# Patient Record
Sex: Female | Born: 1995 | Race: White | Hispanic: No | Marital: Married | Smoking: Never smoker
Health system: Southern US, Community
[De-identification: ages and names within clinical notes are randomized; demographics above are authoritative.]

## PROBLEM LIST (undated history)

## (undated) DIAGNOSIS — J189 Pneumonia, unspecified organism: Secondary | ICD-10-CM

## (undated) DIAGNOSIS — R519 Headache, unspecified: Secondary | ICD-10-CM

## (undated) DIAGNOSIS — F419 Anxiety disorder, unspecified: Secondary | ICD-10-CM

## (undated) DIAGNOSIS — D649 Anemia, unspecified: Secondary | ICD-10-CM

## (undated) DIAGNOSIS — O139 Gestational [pregnancy-induced] hypertension without significant proteinuria, unspecified trimester: Secondary | ICD-10-CM

## (undated) DIAGNOSIS — Z91018 Allergy to other foods: Secondary | ICD-10-CM

## (undated) HISTORY — DX: Anxiety disorder, unspecified: F41.9

## (undated) HISTORY — PX: TONSILLECTOMY: SUR1361

---
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
## 2018-01-24 DIAGNOSIS — F419 Anxiety disorder, unspecified: Secondary | ICD-10-CM | POA: Insufficient documentation

## 2019-02-19 ENCOUNTER — Ambulatory Visit (INDEPENDENT_AMBULATORY_CARE_PROVIDER_SITE_OTHER): Payer: BC Managed Care – PPO | Admitting: Advanced Practice Midwife

## 2019-02-19 ENCOUNTER — Encounter: Payer: Self-pay | Admitting: Advanced Practice Midwife

## 2019-02-19 ENCOUNTER — Other Ambulatory Visit (HOSPITAL_COMMUNITY)
Admission: RE | Admit: 2019-02-19 | Discharge: 2019-02-19 | Disposition: A | Discharge status: Home / Self Care | Payer: BC Managed Care – PPO | Source: Ambulatory Visit | Attending: Advanced Practice Midwife | Admitting: Advanced Practice Midwife

## 2019-02-19 ENCOUNTER — Other Ambulatory Visit: Payer: Self-pay

## 2019-02-19 VITALS — BP 110/70 | Ht 59.0 in | Wt 127.0 lb

## 2019-02-19 DIAGNOSIS — Z3400 Encounter for supervision of normal first pregnancy, unspecified trimester: Secondary | ICD-10-CM | POA: Insufficient documentation

## 2019-02-19 DIAGNOSIS — Z3401 Encounter for supervision of normal first pregnancy, first trimester: Secondary | ICD-10-CM

## 2019-02-19 DIAGNOSIS — Z113 Encounter for screening for infections with a predominantly sexual mode of transmission: Secondary | ICD-10-CM | POA: Insufficient documentation

## 2019-02-19 DIAGNOSIS — Z124 Encounter for screening for malignant neoplasm of cervix: Secondary | ICD-10-CM | POA: Insufficient documentation

## 2019-02-19 DIAGNOSIS — Z3A01 Less than 8 weeks gestation of pregnancy: Secondary | ICD-10-CM

## 2019-02-19 NOTE — Progress Notes (Signed)
NOB today. LMP: 01/03/2019

## 2019-02-19 NOTE — Progress Notes (Signed)
New Obstetric Patient H&P    Chief Complaint: "Desires prenatal care"   History of Present Illness: Patient is a 23 y.o. G1P0 Not Hispanic or Latino female, presents with amenorrhea and positive home pregnancy test. Patient's last menstrual period was 01/03/2019 (exact date). and based on her  LMP, her EDD is Estimated Date of Delivery: 10/10/19 and her EGA is 5037w5d. Cycles are 4-5 days, regular, and occur approximately every : 28 days. Her first PAP smear is today.   She had a urine pregnancy test which was positive 3 week(s)  ago. Her last menstrual period was normal and lasted for  4 or 5 day(s). Since her LMP she claims she has experienced breast tenderness, fatigue, nausea, headaches. She denies vaginal bleeding. 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 This is her first pregnancy.   Since her LMP, she admits to the use of tobacco products  no She claims she has gained  2 pounds since the start of her pregnancy.  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 no  Genetic Screening/Teratology Counseling: (Includes patient, baby's father, or anyone in either family with:)   1. [facility]= [facility]  no 2. Thalassemia ([place], [place], Mediterranean, or Asian background): MCV<80  no 3. Neural tube defect (meningomyelocele, spina bifida, anencephaly)  no 4. Congenital heart defect  no  5. Down syndrome  no 6. Tay-Sachs (Jewish, [place])  no 7. Canavan's Disease  no 8. Sickle cell disease or trait (African)  no  9. Hemophilia or other blood disorders  no  10. Muscular dystrophy  no  11.  Cystic fibrosis  no  12. Huntington's Chorea  no  13. Mental retardation/autism  no 14. Other inherited genetic or chromosomal disorder  no 15. Maternal metabolic disorder (DM, PKU, etc)  no 16. Patient or FOB with a child with a birth defect not listed above no  16a. Patient or FOB with a birth defect themselves no 17. Recurrent pregnancy loss, or stillbirth  no  18. Any medications since LMP other than prenatal vitamins (include vitamins, supplements, OTC meds, drugs, alcohol)  Effexor 75 mg 19. Any other genetic/environmental exposure to discuss  no  Infection History:   1. Lives with someone with TB or TB exposed  no  2. Patient or partner has history of genital herpes  no 3. Rash or viral illness since LMP  no 4. History of STI (GC, CT, HPV, syphilis, HIV)  no 5. History of recent travel :  no  Other pertinent information:  Patient plans to breastfeed    Review of Systems:10 point review of systems negative unless otherwise noted in HPI  Past Medical History:  Past Medical History:  Diagnosis Date  . Anxiety     Past Surgical History:  Past Surgical History:  Procedure Laterality Date  . TONSILLECTOMY      Gynecologic History: Patient's last menstrual period was 01/03/2019 (exact date).  Obstetric History: G1P0  Family History:  History reviewed. No pertinent family history.  Social History:  Social History   Socioeconomic History  .  Marital status: Single    Spouse name: Not on file  . Number of children: Not on file  . Years of education: Not on file  . Highest education level: Not on file  Occupational History  . Not on file  Social Needs  . Financial resource strain: Not on file  . Food insecurity    Worry: Not on file    Inability: Not on file  . Transportation needs    Medical: Not on file    Non-medical: Not on file  Tobacco Use  . Smoking status: Never Smoker  . Smokeless tobacco: Never Used  Substance and Sexual Activity  . Alcohol use:  Not Currently  . Drug use: Never  . Sexual activity: Yes    Birth control/protection: None  Lifestyle  . Physical activity    Days per week: Not on file    Minutes per session: Not on file  . Stress: Not on file  Relationships  . Social Herbalist on phone: Not on file    Gets together: Not on file    Attends religious service: Not on file    Active member of club or organization: Not on file    Attends meetings of clubs or organizations: Not on file    Relationship status: Not on file  . Intimate partner violence    Fear of current or ex partner: Not on file    Emotionally abused: Not on file    Physically abused: Not on file    Forced sexual activity: Not on file  Other Topics Concern  . Not on file  Social History Narrative  . Not on file    Allergies:  Allergies  Allergen Reactions  . Pork-Derived Products Swelling    Face swelling and severe cramps  . Beef-Derived Products     Medications: Prior to Admission medications   Medication Sig Start Date End Date Taking? Authorizing Provider  venlafaxine XR (EFFEXOR-XR) 75 MG 24 hr capsule Take 75 mg by mouth daily. 02/12/19  Yes [provider]    Physical Exam Vitals: Blood pressure 110/70, height 4\' 11"  (1.499 m), weight 127 lb (57.6 kg), last menstrual period 01/03/2019.  General: NAD HEENT: normocephalic, anicteric Thyroid: no enlargement, no palpable nodules Pulmonary: No increased work of breathing, CTAB Cardiovascular: RRR, distal pulses 2+ Abdomen: NABS, soft, non-tender, non-distended.  Umbilicus without lesions.  No hepatomegaly, splenomegaly or masses palpable. No evidence of hernia  Genitourinary:  External: Normal external female genitalia.  Normal urethral meatus, normal  Bartholin's and Skene's glands.    Vagina: Normal vaginal mucosa, no evidence of prolapse.    Cervix: Grossly normal in appearance, no bleeding, no CMT  Uterus:  Non-enlarged, mobile, normal contour.    Adnexa:  ovaries non-enlarged, no adnexal masses  Rectal: deferred Extremities: no edema, erythema, or tenderness Neurologic: Grossly intact Psychiatric: mood appropriate, affect full  The following were addressed during this visit:  Breastfeeding Education - Early initiation of breastfeeding    Comments: Keeps milk supply adequate, helps contract uterus and slow bleeding, and early milk is the perfect first food and is easy to digest.   - The importance of exclusive breastfeeding    Comments: Provides antibodies, Lower risk of breast and ovarian cancers, and type-2 diabetes,Helps your body recover, Reduced chance of SIDS.   - The importance of early skin-to-skin contact    Comments: Keeps baby warm and secure, helps keep baby's blood sugar up and breathing steady, easier to bond  and breastfeed, and helps calm baby.  - Effective positioning and attachment    Comments: Helps my baby to get enough breast milk, helps to produce an adequate milk supply, and helps prevent nipple pain and damage   - Exclusive breastfeeding for the first 6 months    Comments: Builds a healthy milk supply and keeps it up, protects baby from sickness and disease, and breastmilk has everything your baby needs for the first 6 months.   Assessment: 23 y.o. G1P0 at [redacted]w[redacted]d by exact LMP presenting to initiate prenatal care  Plan: 1) Avoid alcoholic beverages. 2) Patient encouraged not to smoke.  3) Discontinue the use of all non-medicinal drugs and chemicals.  4) Take prenatal vitamins daily.  5) Nutrition, food safety (fish, cheese advisories, and high nitrite foods) and exercise discussed. 6) Hospital and practice style discussed with cross coverage system.  7) Genetic Screening, such as with 1st Trimester Screening, cell free fetal DNA, AFP testing, and Ultrasound, as well as with amniocentesis and CVS as appropriate, is discussed with patient. At the conclusion of today's visit patient requested 1st trimester/NT  screen genetic testing at 12 weeks 8) Patient is asked about travel to areas at risk for the Zika virus, and counseled to avoid travel and exposure to mosquitoes or sexual partners who may have themselves been exposed to the virus. Testing is discussed, and will be ordered as appropriate.  9) PAPtima, urine culture today 10) return in 1 week for dating and rob 11) NOB panel (future ordered) and 1st trimester screen/NT scan at 12 weeks   Tresea Mall, CNM [facility] 02/19/2019, 3:22 PM

## 2019-02-19 NOTE — Patient Instructions (Signed)
Exercise During Pregnancy Exercise is an important part of being healthy for people of all ages. Exercise improves the function of your heart and lungs and helps you maintain strength, flexibility, and a healthy body weight. Exercise also boosts energy levels and elevates mood. Most women should exercise regularly during pregnancy. In rare cases, women with certain medical conditions or complications may be asked to limit or avoid exercise during pregnancy. How does this affect me? Along with maintaining general strength and flexibility, exercising during pregnancy can help:  Keep strength in muscles that are used during labor and childbirth.  Decrease low back pain.  Reduce symptoms of depression.  Control weight gain during pregnancy.  Reduce the risk of needing insulin if you develop diabetes during pregnancy.  Decrease the risk of cesarean delivery.  Speed up your recovery after giving birth. How does this affect my baby? Exercise can help you have a healthy pregnancy. Exercise does not cause premature birth. It will not cause your baby to weigh less at birth. What exercises can I do? Many exercises are safe for you to do during pregnancy. Do a variety of exercises that safely increase your heart and breathing rates and help you build and maintain muscle strength. Do exercises exactly as told by your health care provider. You may do these exercises:  Walking or hiking.  Swimming.  Water aerobics.  Riding a stationary bike.  Strength training.  Modified yoga or Pilates. Tell your instructor that you are pregnant. Avoid overstretching, and avoid lying on your back for long periods of time.  Running or jogging. Only choose this type of exercise if you: ? Ran or jogged regularly before your pregnancy. ? Can run or jog and still talk in complete sentences. What exercises should I avoid? Depending on your level of fitness and whether you exercised regularly before your  pregnancy, you may be told to limit high-intensity exercise. You can tell that you are exercising at a high intensity if you are breathing much harder and faster and cannot hold a conversation while exercising. You must avoid:  Contact sports.  Activities that put you at risk for falling on or being hit in the belly, such as downhill skiing, water skiing, surfing, rock climbing, cycling, gymnastics, and horseback riding.  Scuba diving.  Skydiving.  Yoga or Pilates in a room that is heated to high temperatures.  Jogging or running, unless you ran or jogged regularly before your pregnancy. While jogging or running, you should always be able to talk in full sentences. Do not run or jog so fast that you are unable to have a conversation.  Do not exercise at more than 6,000 feet above sea level (high elevation) if you are not used to exercising at high elevation. How do I exercise in a safe way?   Avoid overheating. Do not exercise in very high temperatures.  Wear loose-fitting, breathable clothes.  Avoid dehydration. Drink enough water before, during, and after exercise to keep your urine pale yellow.  Avoid overstretching. Because of hormone changes during pregnancy, it is easy to overstretch muscles, tendons, and ligaments during pregnancy.  Start slowly and ask your health care provider to recommend the types of exercise that are safe for you.  Do not exercise to lose weight. Follow these instructions at home:  Exercise on most days or all days of the week. Try to exercise for 30 minutes a day, 5 days a week, unless your health care provider tells you not to.  If  you actively exercised before your pregnancy and you are healthy, your health care provider may tell you to continue to do moderate to high-intensity exercise.  If you are just starting to exercise or did not exercise much before your pregnancy, your health care provider may tell you to do low to moderate-intensity  exercise. Questions to ask your health care provider  Is exercise safe for me?  What are signs that I should stop exercising?  Does my health condition mean that I should not exercise during pregnancy?  When should I avoid exercising during pregnancy? Stop exercising and contact a health care provider if: You have any unusual symptoms, such as:  Mild contractions of the uterus or cramps in the abdomen.  Dizziness that does not go away when you rest. Stop exercising and get help right away if: You have any unusual symptoms, such as:  Sudden, severe pain in your low back or your belly.  Mild contractions of the uterus or cramps in the abdomen that do not improve with rest and drinking fluids.  Chest pain.  Bleeding or fluid leaking from your vagina.  Shortness of breath. These symptoms may represent a serious problem that is an emergency. Do not wait to see if the symptoms will go away. Get medical help right away. Call your local emergency services (911 in the [place] Do not drive yourself to the hospital. Summary  Most women should exercise regularly throughout pregnancy. In rare cases, women with certain medical conditions or complications may be asked to limit or avoid exercise during pregnancy.  Do not exercise to lose weight during pregnancy.  Your health care provider will tell you what level of physical activity is right for you.  Stop exercising and contact a health care provider if you have mild contractions of the uterus or cramps in the abdomen. Get help right away if these contractions or cramps do not improve with rest and drinking fluids.  Stop exercising and get help right away if you have sudden, severe pain in your low back or belly, chest pain, shortness of breath, or bleeding or leaking of fluid from your vagina. This information is not intended to replace advice given to you by your health care provider. Make sure you discuss any questions you have with your  health care provider. Document Released: 03/06/2005 Document Revised: 06/27/2018 Document Reviewed: 04/10/2018 Elsevier Patient Education  2020 [place] for Pregnant Women While you are pregnant, your body requires additional nutrition to help support your growing baby. You also have a higher need for some vitamins and minerals, such as folic acid, calcium, iron, and vitamin D. Eating a healthy, well-balanced diet is very important for your health and your baby's health. Your need for extra calories varies for the three 53-monthsegments of your pregnancy (trimesters). For most women, it is recommended to consume:  150 extra calories a day during the first trimester.  300 extra calories a day during the second trimester.  300 extra calories a day during the third trimester. What are tips for following this plan?   Do not try to lose weight or go on a diet during pregnancy.  Limit your overall intake of foods that have "empty calories." These are foods that have little nutritional value, such as sweets, desserts, candies, and sugar-sweetened beverages.  Eat a variety of foods (especially fruits and vegetables) to get a full range of vitamins and minerals.  Take a prenatal vitamin to help meet your additional vitamin  and mineral needs during pregnancy, specifically for folic acid, iron, calcium, and vitamin D.  Remember to stay active. Ask your health care provider what types of exercise and activities are safe for you.  Practice good food safety and cleanliness. Wash your hands before you eat and after you prepare raw meat. Wash all fruits and vegetables well before peeling or eating. Taking these actions can help to prevent food-borne illnesses that can be very dangerous to your baby, such as listeriosis. Ask your health care provider for more information about listeriosis. What does 150 extra calories look like? Healthy options that provide 150 extra calories each day  could be any of the following:  6-8 oz (170-230 g) of plain low-fat yogurt with  cup of berries.  1 apple with 2 teaspoons (11 g) of peanut butter.  Cut-up vegetables with  cup (60 g) of hummus.  8 oz (230 mL) or 1 cup of low-fat chocolate milk.  1 stick of string cheese with 1 medium orange.  1 peanut butter and jelly sandwich that is made with one slice of whole-wheat bread and 1 tsp (5 g) of peanut butter. For 300 extra calories, you could eat two of those healthy options each day. What is a healthy amount of weight to gain? The right amount of weight gain for you is based on your BMI before you became pregnant. If your BMI:  Was less than 18 (underweight), you should gain 28-40 lb (13-18 kg).  Was 18-24.9 (normal), you should gain 25-35 lb (11-16 kg).  Was 25-29.9 (overweight), you should gain 15-25 lb (7-11 kg).  Was 30 or greater (obese), you should gain 11-20 lb (5-9 kg). What if I am having twins or multiples? Generally, if you are carrying twins or multiples:  You may need to eat 300-600 extra calories a day.  The recommended range for total weight gain is 25-54 lb (11-25 kg), depending on your BMI before pregnancy.  Talk with your health care provider to find out about nutritional needs, weight gain, and exercise that is right for you. What foods can I eat?  Grains All grains. Choose whole grains, such as whole-wheat bread, oatmeal, or [place] rice. Vegetables All vegetables. Eat a variety of colors and types of vegetables. Remember to wash your vegetables well before peeling or eating. Fruits All fruits. Eat a variety of colors and types of fruit. Remember to wash your fruits well before peeling or eating. Meats and other protein foods Lean meats, including chicken, [place], fish, and lean cuts of beef, veal, or pork. If you eat fish or seafood, choose options that are higher in omega-3 fatty acids and lower in mercury, such as salmon, herring, mussels, trout,  sardines, pollock, shrimp, crab, and lobster. Tofu. Tempeh. Beans. Eggs. Peanut butter and other nut butters. Make sure that all meats, poultry, and eggs are cooked to food-safe temperatures or "well-done." Two or more servings of fish are recommended each week in order to get the most benefits from omega-3 fatty acids that are found in seafood. Choose fish that are lower in mercury. You can find more information online:  [place]Gaming.ch Dairy Pasteurized milk and milk alternatives (such as almond milk). Pasteurized yogurt and pasteurized cheese. Cottage cheese. Sour cream. Beverages Water. Juices that contain 100% fruit juice or vegetable juice. Caffeine-free teas and decaffeinated coffee. Drinks that contain caffeine are okay to drink, but it is better to avoid caffeine. Keep your total caffeine intake to less than 200 mg each day (which  is 12 oz or 355 mL of coffee, tea, or soda) or the limit as told by your health care provider. Fats and oils Fats and oils are okay to include in moderation. Sweets and desserts Sweets and desserts are okay to include in moderation. Seasoning and other foods All pasteurized condiments. The items listed above may not be a complete list of recommended foods and beverages. Contact your dietitian for more options. The items listed above may not be a complete list of foods and beverages [you/your child] can eat. Contact a dietitian for more information. What foods are not recommended? Vegetables Raw (unpasteurized) vegetable juices. Fruits Unpasteurized fruit juices. Meats and other protein foods Lunch meats, bologna, hot dogs, or other deli meats. (If you must eat those meats, reheat them until they are steaming hot.) Refrigerated pat, meat spreads from a meat counter, smoked seafood that is found in the refrigerated section of a store. Raw or undercooked meats, poultry, and eggs. Raw fish, such as sushi or sashimi. Fish that have high mercury content, such as  tilefish, shark, swordfish, and king mackerel. To learn more about mercury in fish, talk with your health care provider or look for online resources, such as:  [place]ch Dairy Raw (unpasteurized) milk and any foods that have raw milk in them. Soft cheeses, such as feta, queso blanco, queso fresco, Brie, Camembert cheeses, blue-veined cheeses, and Panela cheese (unless it is made with pasteurized milk, which must be stated on the label). Beverages Alcohol. Sugar-sweetened beverages, such as sodas, teas, or energy drinks. Seasoning and other foods Homemade fermented foods and drinks, such as pickles, sauerkraut, or kombucha drinks. (Store-bought pasteurized versions of these are okay.) Salads that are made in a store or deli, such as ham salad, chicken salad, egg salad, tuna salad, and seafood salad. The items listed above may not be a complete list of foods and beverages to avoid. Contact your dietitian for more information. The items listed above may not be a complete list of foods and beverages [you/your child] should avoid. Contact a dietitian for more information. Where to find more information To calculate the number of calories you need based on your height, weight, and activity level, you can use an online calculator such as:  MobileTransition.ch To calculate how much weight you should gain during pregnancy, you can use an online pregnancy weight gain calculator such as:  StreamingFood.com.cy Summary  While you are pregnant, your body requires additional nutrition to help support your growing baby.  Eat a variety of foods, especially fruits and vegetables to get a full range of vitamins and minerals.  Practice good food safety and cleanliness. Wash your hands before you eat and after you prepare raw meat. Wash all fruits and vegetables well before peeling or eating. Taking these actions can help to prevent food-borne illnesses, such as  listeriosis, that can be very dangerous to your baby.  Do not eat raw meat or fish. Do not eat fish that have high mercury content, such as tilefish, shark, swordfish, and king mackerel. Do not eat unpasteurized (raw) dairy.  Take a prenatal vitamin to help meet your additional vitamin and mineral needs during pregnancy, specifically for folic acid, iron, calcium, and vitamin D. This information is not intended to replace advice given to you by your health care provider. Make sure you discuss any questions you have with your health care provider. Document Released: 12/19/2013 Document Revised: 06/27/2018 Document Reviewed: 12/01/2016 Elsevier Patient Education  2020 [employer]. Prenatal Care Prenatal care  is health care during pregnancy. It helps you and your unborn baby (fetus) stay as healthy as possible. Prenatal care may be provided by a midwife, a family practice health care provider, or a childbirth and pregnancy specialist (obstetrician). How does this affect me? During pregnancy, you will be closely monitored for any new conditions that might develop. To lower your risk of pregnancy complications, you and your health care provider will talk about any underlying conditions you have. How does this affect my baby? Early and consistent prenatal care increases the chance that your baby will be healthy during pregnancy. Prenatal care lowers the risk that your baby will be:  Born early (prematurely).  Smaller than expected at birth (small for gestational age). What can I expect at the first prenatal care visit? Your first prenatal care visit will likely be the longest. You should schedule your first prenatal care visit as soon as you know that you are pregnant. Your first visit is a good time to talk about any questions or concerns you have about pregnancy. At your visit, you and your health care provider will talk about:  Your medical history, including: ? Any past pregnancies. ? Your  family's medical history. ? The baby's father's medical history. ? Any long-term (chronic) health conditions you have and how you manage them. ? Any surgeries or procedures you have had. ? Any current over-the-counter or prescription medicines, herbs, or supplements you are taking.  Other factors that could pose a risk to your baby, including:  Your home setting and your stress levels, including: ? Exposure to abuse or violence. ? Household financial strain. ? Mental health conditions you have.  Your daily health habits, including diet and exercise. Your health care provider will also:  Measure your weight, height, and blood pressure.  Do a physical exam, including a pelvic and breast exam.  Perform blood tests and urine tests to check for: ? Urinary tract infection. ? Sexually transmitted infections (STIs). ? Low iron levels in your blood (anemia). ? Blood type and certain proteins on red blood cells (Rh antibodies). ? Infections and immunity to viruses, such as hepatitis B and rubella. ? HIV (human immunodeficiency virus).  Do an ultrasound to confirm your baby's growth and development and to help predict your estimated due date (EDD). This ultrasound is done with a probe that is inserted into the vagina (transvaginal ultrasound).  Discuss your options for genetic screening.  Give you information about how to keep yourself and your baby healthy, including: ? Nutrition and taking vitamins. ? Physical activity. ? How to manage pregnancy symptoms such as nausea and vomiting (morning sickness). ? Infections and substances that may be harmful to your baby and how to avoid them. ? Food safety. ? Dental care. ? Working. ? Travel. ? Warning signs to watch for and when to call your health care provider. How often will I have prenatal care visits? After your first prenatal care visit, you will have regular visits throughout your pregnancy. The visit schedule is often as  follows:  Up to week 28 of pregnancy: once every 4 weeks.  28-36 weeks: once every 2 weeks.  After 36 weeks: every week until delivery. Some women may have visits more or less often depending on any underlying health conditions and the health of the baby. Keep all follow-up and prenatal care visits as told by your health care provider. This is important. What happens during routine prenatal care visits? Your health care provider will:  Measure your  weight and blood pressure.  Check for fetal heart sounds.  Measure the height of your uterus in your abdomen (fundal height). This may be measured starting around week 20 of pregnancy.  Check the position of your baby inside your uterus.  Ask questions about your diet, sleeping patterns, and whether you can feel the baby move.  Review warning signs to watch for and signs of labor.  Ask about any pregnancy symptoms you are having and how you are dealing with them. Symptoms may include: ? Headaches. ? Nausea and vomiting. ? Vaginal discharge. ? Swelling. ? Fatigue. ? Constipation. ? Any discomfort, including back or pelvic pain. Make a list of questions to ask your health care provider at your routine visits. What tests might I have during prenatal care visits? You may have blood, urine, and imaging tests throughout your pregnancy, such as:  Urine tests to check for glucose, protein, or signs of infection.  Glucose tests to check for a form of diabetes that can develop during pregnancy (gestational diabetes mellitus). This is usually done around week 24 of pregnancy.  An ultrasound to check your baby's growth and development and to check for birth defects. This is usually done around week 20 of pregnancy.  A test to check for group B strep (GBS) infection. This is usually done around week 36 of pregnancy.  Genetic testing. This may include blood or imaging tests, such as an ultrasound. Some genetic tests are done during the first  trimester and some are done during the second trimester. What else can I expect during prenatal care visits? Your health care provider may recommend getting certain vaccines during pregnancy. These may include:  A yearly flu shot (annual influenza vaccine). This is especially important if you will be pregnant during flu season.  Tdap (tetanus, diphtheria, pertussis) vaccine. Getting this vaccine during pregnancy can protect your baby from whooping cough (pertussis) after birth. This vaccine may be recommended between weeks 27 and 36 of pregnancy. Later in your pregnancy, your health care provider may give you information about:  Childbirth and breastfeeding classes.  Choosing a health care provider for your baby.  Umbilical cord banking.  Breastfeeding.  Birth control after your baby is born.  The hospital labor and delivery unit and how to tour it.  Registering at the hospital before you go into labor. Where to find more information  Office on Women's Health: [place]awomenshealth.gov  American Pregnancy Association: americanpregnancy.org  March of Dimes: marchofdimes.org Summary  Prenatal care helps you and your baby stay as healthy as possible during pregnancy.  Your first prenatal care visit will most likely be the longest.  You will have visits and tests throughout your pregnancy to monitor your health and your baby's health.  Bring a list of questions to your visits to ask your health care provider.  Make sure to keep all follow-up and prenatal care visits with your health care provider. This information is not intended to replace advice given to you by your health care provider. Make sure you discuss any questions you have with your health care provider. Document Released: 03/09/2003 Document Revised: 06/26/2018 Document Reviewed: 03/05/2017 Elsevier Patient Education  2020 [employer] Inc[employer]   Perinatal Depression When a woman feels excessive sadness, anger, or anxiety during  pregnancy or during the first 12 months after she gives birth, she has a condition called perinatal depression. Depression can interfere with work, school, relationships, and other everyday activities. If it is not managed properly, it can also cause  problems in the mother and her baby. Sometimes, perinatal depression is left untreated because symptoms are thought to be normal mood swings during and right after pregnancy. If you have symptoms of depression, it is important to talk with your health care provider. What are the causes? The exact cause of this condition is not known. Hormonal changes during and after pregnancy may play a role in causing perinatal depression. What increases the risk? You are more likely to develop this condition if:  You have a personal or family history of depression, anxiety, or mood disorders.  You experience a stressful life event during pregnancy, such as the death of a loved one.  You have a lot of regular life stress.  You do not have support from family members or loved ones, or you are in an abusive relationship. What are the signs or symptoms? Symptoms of this condition include:  Feeling sad or hopeless.  Feelings of guilt.  Feeling irritable or overwhelmed.  Changes in your appetite.  Lack of energy or motivation.  Sleep problems.  Difficulty concentrating or completing tasks.  Loss of interest in hobbies or relationships.  Headaches or stomach problems that do not go away. How is this diagnosed? This condition is diagnosed based on a physical exam and mental evaluation. In some cases, your health care provider may use a depression screening tool. These tools include a list of questions that can help a health care provider diagnose depression. Your health care provider may refer you to a mental health expert who specializes in depression. How is this treated? This condition may be treated with:  Medicines. Your health care provider will  only give you medicines that have been proven safe for pregnancy and breastfeeding.  Talk therapy with a mental health professional to help change your patterns of thinking (cognitive behavioral therapy).  Support groups.  Brain stimulation or light therapies.  Stress reduction therapies, such as mindfulness. Follow these instructions at home: Lifestyle  Do not use any products that contain nicotine or tobacco, such as cigarettes and e-cigarettes. If you need help quitting, ask your health care provider.  Do not use alcohol when you are pregnant. After your baby is born, limit alcohol intake to no more than 1 drink a day. One drink equals 12 oz of beer, 5 oz of wine, or 1 oz of hard liquor.  Consider joining a support group for new mothers. Ask your health care provider for recommendations.  Take good care of yourself. Make sure you: ? Get plenty of sleep. If you are having trouble sleeping, talk with your health care provider. ? Eat a healthy diet. This includes plenty of fruits and vegetables, whole grains, and lean proteins. ? Exercise regularly, as told by your health care provider. Ask your health care provider what exercises are safe for you. General instructions  Take over-the-counter and prescription medicines only as told by your health care provider.  Talk with your partner or family members about your feelings during pregnancy. Share any concerns or anxieties that you may have.  Ask for help with tasks or chores when you need it. Ask friends and family members to provide meals, watch your children, or help with cleaning.  Keep all follow-up visits as told by your health care provider. This is important. Contact a health care provider if:  You (or people close to you) notice that you have any symptoms of depression.  You have depression and your symptoms get worse.  You experience side effects  from medicines, such as nausea or sleep problems. Get help right away  if:  You feel like hurting yourself, your baby, or someone else. If you ever feel like you may hurt yourself or others, or have thoughts about taking your own life, get help right away. You can go to your nearest emergency department or call:  Your local emergency services (911 in the [place]  A suicide crisis helpline, such as the National Suicide Prevention Lifeline at 438-625-9727. This is open 24 hours a day. Summary  Perinatal depression is when a woman feels excessive sadness, anger, or anxiety during pregnancy or during the first 12 months after she gives birth.  If perinatal depression is not treated, it can lead to health problems for the mother and her baby.  This condition is treated with medicines, talk therapy, stress reduction therapies, or a combination of two or more treatments.  Talk with your partner or family members about your feelings. Do not be afraid to ask for help. This information is not intended to replace advice given to you by your health care provider. Make sure you discuss any questions you have with your health care provider. Document Released: 05/03/2016 Document Revised: 03/09/2017 Document Reviewed: 05/03/2016 Elsevier Patient Education  2020 Elsevier Inc. Perinatal Anxiety When a woman feels excessive tension or worry (anxiety) during pregnancy or during the first 12 months after she gives birth, she has a condition called perinatal anxiety. Anxiety can interfere with work, school, relationships, and other everyday activities. If it is not managed properly, it can also cause problems in the mother and her baby.  If you are pregnant and you have symptoms of an anxiety disorder, it is important to talk with your health care provider. What are the causes? The exact cause of this condition is not known. Hormonal changes during and after pregnancy may play a role in causing perinatal anxiety. What increases the risk? You are more likely to develop this  condition if:  You have a personal or family history of depression, anxiety, or mood disorders.  You experience a stressful life event during pregnancy, such as the death of a loved one.  You have a lot of regular life stress, such as being a single parent.  You have thyroid problems. What are the signs or symptoms? Perinatal anxiety can be different for everyone. It may include:  Panic attacks (panic disorder). These are intense episodes of fear or discomfort that may also cause sweating, nausea, shortness of breath, or fear of dying. They usually last 5-15 minutes.  Reliving an upsetting (traumatic) event through distressing thoughts, dreams, or flashbacks (post-traumatic stress disorder, or PTSD).  Excessive worry about multiple problems (generalized anxiety disorder).  Fear and stress about leaving certain people or loved ones (separation anxiety).  Performing repetitive tasks (compulsions) to relieve stress or worry (obsessive compulsive disorder, or OCD).  Fear of certain objects or situations (phobias).  Excessive worrying, such as a constant feeling that something bad is going to happen.  Inability to relax.  Difficulty concentrating.  Sleep problems.  Frequent nightmares or disturbing thoughts. How is this diagnosed? This condition is diagnosed based on a physical exam and mental evaluation. In some cases, your health care provider may use an anxiety screening tool. These tools include a list of questions that can help a health care provider diagnose anxiety. Your health care provider may refer you to a mental health expert who specializes in anxiety. How is this treated? This condition may be treated  with:  Medicines. Your health care provider will only give you medicines that have been proven safe for pregnancy and breastfeeding.  Talk therapy with a mental health professional to help change your patterns of thinking (cognitive behavioral  therapy).  Mindfulness-based stress reduction.  Other relaxation therapies, such as deep breathing or guided muscle relaxation.  Support groups. Follow these instructions at home: Lifestyle  Do not use any products that contain nicotine or tobacco, such as cigarettes and e-cigarettes. If you need help quitting, ask your health care provider.  Do not use alcohol when you are pregnant. After your baby is born, limit alcohol intake to no more than 1 drink a day. One drink equals 12 oz of beer, 5 oz of wine, or 1 oz of hard liquor.  Consider joining a support group for new mothers. Ask your health care provider for recommendations.  Take good care of yourself. Make sure you: ? Get plenty of sleep. If you are having trouble sleeping, talk with your health care provider. ? Eat a healthy diet. This includes plenty of fruits and vegetables, whole grains, and lean proteins. ? Exercise regularly, as told by your health care provider. Ask your health care provider what exercises are safe for you. General instructions  Take over-the-counter and prescription medicines only as told by your health care provider.  Talk with your partner or family members about your feelings during pregnancy. Share any concerns or fears that you may have.  Ask for help with tasks or chores when you need it. Ask friends and family members to provide meals, watch your children, or help with cleaning.  Keep all follow-up visits as told by your health care provider. This is important. Contact a health care provider if:  You (or people close to you) notice that you have any symptoms of anxiety or depression.  You have anxiety and your symptoms get worse.  You experience side effects from medicines, such as nausea or sleep problems. Get help right away if:  You feel like hurting yourself, your baby, or someone else. If you ever feel like you may hurt yourself or others, or have thoughts about taking your own life,  get help right away. You can go to your nearest emergency department or call:  Your local emergency services (911 in the [place]  A suicide crisis helpline, such as the National Suicide Prevention Lifeline at 505-075-8789. This is open 24 hours a day. Summary  Perinatal anxiety is when a woman feels excessive tension or worry during pregnancy or during the first 12 months after she gives birth.  Perinatal anxiety may include panic attacks, post-traumatic stress disorder, separation anxiety, phobias, or generalized anxiety.  Perinatal anxiety can cause physical health problems in the mother and baby if not properly managed.  This condition is treated with medicines, talk therapy, stress reduction therapies, or a combination of two or more treatments.  Talk with your partner or family members about your concerns or fears. Do not be afraid to ask for help. This information is not intended to replace advice given to you by your health care provider. Make sure you discuss any questions you have with your health care provider. Document Released: 05/03/2016 Document Revised: 03/09/2017 Document Reviewed: 05/03/2016 Elsevier Patient Education  2020 [employer].    COVID-19 and Your Pregnancy FAQ  How can I prevent infection with COVID-19 during my pregnancy? Social distancing is key. Please limit any interactions in public. Try and work from home if possible. Frequently wash  your hands after touching possibly contaminated surfaces. Avoid touching your face.  Minimize trips to the store. Consider online ordering when possible.   Should I wear a mask? YES. It is recommended by the CDC that all people wear a cloth mask or facial covering in public. You should wear a mask to your visits in the office. This will help reduce transmission as well as your risk or acquiring COVID-19. New studies are showing that even asymptomatic individuals can spread the virus from talking.   Where can I get a  mask? [place] and the city of [place] are partnering to provide masks to community members. You can pick up a mask from several locations. This website also has instructions about how to make a mask by sewing or without sewing by using a t-shirt or bandana.  https://www.[place].gov/i-want-to/learn-about/covid-19-information-and-updates/covid-19-face-mask-project  Studies have shown that if you were a tube or nylon stocking from pantyhose over a cloth mask it makes the cloth mask almost as effective as a N95 mask.  [employer]de  What are the symptoms of COVID-19? Fever (greater than 100.4 F), dry cough, shortness of breath.  Am I more at risk for COVID-19 since I am pregnant? There is not currently data showing that pregnant women are more adversely impacted by COVID-19 than the general population. However, we know that pregnant women tend to have worse respiratory complications from similar diseases such as the flu and SARS and for this reason should be considered an at-risk population.  What do I do if I am experiencing the symptoms of COVID-19? Testing is being limited because of test availability. If you are experiencing symptoms you should quarantine yourself, and the members of your family, for at least 2 weeks at home.   Please visit this website for more information: DiscoHelp.si.html  When should I go to the Emergency Room? Please go to the emergency room if you are experiencing ANY of these symptoms*:  1.    Difficulty breathing or shortness of breath 2.    Persistent pain or pressure in the chest 3.    Confusion or difficulty being aroused (or awakened) 4.    Bluish lips or face  *This list is not all inclusive. Please consult our office for any other symptoms that are severe or  concerning.  What do I do if I am having difficulty breathing? You should go to the Emergency Room for evaluation. At this time they have a tent set up for evaluating patients with COVID-19 symptoms.   How will my prenatal care be different because of the COVID-19 pandemic? It has been recommended to reduce the frequency of face-to-face visits and use resources such as telephone and virtual visits when possible. Using a scale, blood pressure machine and fetal doppler at home can further help reduce face-to-face visits. You will be provided with additional information on this topic.  We ask that you come to your visits alone to minimize potential exposures to  COVID-19.  How can I receive childbirth education? At this time in-person classes have been cancelled. You can register for online childbirth education, breastfeeding, and newborn care classes.  Please visit:  [email or web address] for more information  How will my hospital birth experience be different? The hospital is currently limiting visitors. This means that while you are in labor you can only have one person at the hospital with you. Additional family members will not be allowed to wait in the building or outside your room. Your one support person can be the  father of the baby, a relative, a doula, or a friend. Once one support person is designated that person will wear a band. This band cannot be shared with multiple people.  Nitrous Gas is not being offered for pain relief since the tubing and filter for the machine can not be sanitized in a way to guarantee prevention of transmission of COVID-19.  Nasal cannula use of oxygen for fetal indications has also been discontinued.  Currently a clear plastic sheet is being hung between mom and the delivering provider during pushing and delivery to help prevent transmission of COVID-19.      How long will I stay in the hospital for after giving birth? It is also recommended that  discharge home be expedited during the COVID-19 outbreak. This means staying for 1 day after a vaginal delivery and 2 days after a cesarean section. Patients who need to stay longer for medical reasons are allowed to do so, but the goal will be for expedited discharge home.   What if I have COVID-19 and I am in labor? We ask that you wear a mask while on labor and delivery. We will try and accommodate you being placed in a room that is capable of filtering the air. Please call ahead if you are in labor and on your way to the hospital. The phone number for labor and delivery at [facility] is [phone].  If I have COVID-19 when my baby is born how can I prevent my baby from contracting COVID-19? This is an issue that will have to be discussed on a case-by-case basis. Current recommendations suggest providing separate isolation rooms for both the mother and new infant as well as limiting visitors. However, there are practical challenges to this recommendation. The situation will assuredly change and decisions will be influenced by the desires of the mother and availability of space.  Some suggestions are the use of a curtain or physical barrier between mom and infant, hand hygiene, mom wearing a mask, or 6 feet of spacing between a mom and infant.   Can I breastfeed during the COVID-19 pandemic?   Yes, breastfeeding is encouraged.  Can I breastfeed if I have COVID-19? Yes. Covid-19 has not been found in breast milk. This means you cannot give COVID-19 to your child through breast milk. Breast feeding will also help pass antibodies to fight infection to your baby.   What precautions should I take when breastfeeding if I have COVID-19? If a mother and newborn do room-in and the mother wishes to feed at the breast, she should put on a facemask and practice hand hygiene before each feeding.  What precautions should I take when pumping if I have COVID-19? Prior to expressing  breast milk, mothers should practice hand hygiene. After each pumping session, all parts that come into contact with breast milk should be thoroughly washed and the entire pump should be appropriately disinfected per the manufacturers instructions. This expressed breast milk should be fed to the newborn by a healthy caregiver.  What if I am pregnant and work in healthcare? Based on limited data regarding COVID-19 and pregnancy, ACOG currently does not propose creating additional restrictions on pregnant health care personnel because of COVID-19 alone. Pregnant women do not appear to be at higher risk of severe disease related to COVID-19. Pregnant health care personnel should follow CDC risk assessment and infection control guidelines for health care personnel exposed to patients with suspected or confirmed COVID-19. Adherence to recommended  infection prevention and control practices is an important part of protecting all health care personnel in health care settings.    Information on COVID-19 in pregnancy is very limited; however, facilities may want to consider limiting exposure of pregnant health care personnel to patients with confirmed or suspected COVID-19 infection, especially during higher-risk procedures (eg, aerosol-generating procedures), if feasible, based on staffing availability.

## 2019-02-21 LAB — CYTOLOGY - PAP
Chlamydia: NEGATIVE
Comment: NEGATIVE
Comment: NEGATIVE
Comment: NORMAL
Diagnosis: NEGATIVE
Neisseria Gonorrhea: NEGATIVE
Trichomonas: NEGATIVE

## 2019-02-21 LAB — URINE CULTURE

## 2019-03-07 ENCOUNTER — Ambulatory Visit (INDEPENDENT_AMBULATORY_CARE_PROVIDER_SITE_OTHER): Payer: BC Managed Care – PPO

## 2019-03-07 ENCOUNTER — Ambulatory Visit (INDEPENDENT_AMBULATORY_CARE_PROVIDER_SITE_OTHER): Payer: BC Managed Care – PPO | Admitting: Certified Nurse Midwife

## 2019-03-07 ENCOUNTER — Other Ambulatory Visit: Payer: Self-pay

## 2019-03-07 VITALS — BP 110/80 | Wt 124.0 lb

## 2019-03-07 DIAGNOSIS — Z3A09 9 weeks gestation of pregnancy: Secondary | ICD-10-CM

## 2019-03-07 DIAGNOSIS — Z3400 Encounter for supervision of normal first pregnancy, unspecified trimester: Secondary | ICD-10-CM

## 2019-03-07 DIAGNOSIS — Z91018 Allergy to other foods: Secondary | ICD-10-CM

## 2019-03-07 DIAGNOSIS — Z3687 Encounter for antenatal screening for uncertain dates: Secondary | ICD-10-CM | POA: Diagnosis not present

## 2019-03-07 DIAGNOSIS — Z3401 Encounter for supervision of normal first pregnancy, first trimester: Secondary | ICD-10-CM

## 2019-03-07 DIAGNOSIS — Z1379 Encounter for other screening for genetic and chromosomal anomalies: Secondary | ICD-10-CM

## 2019-03-07 LAB — POCT URINALYSIS DIPSTICK OB
Glucose, UA: NEGATIVE
POC,PROTEIN,UA: NEGATIVE

## 2019-03-07 NOTE — Progress Notes (Signed)
C/o no appetite

## 2019-03-09 DIAGNOSIS — Z91018 Allergy to other foods: Secondary | ICD-10-CM | POA: Insufficient documentation

## 2019-03-09 NOTE — Progress Notes (Signed)
ROB at [facility] gestation: Doing well. Lost 3#. No vomiting, but decreased appetite.  Dating scan today: CR with 9wk4d CRL and FCA 169 Desires first trimester screen-will get with NOB labs in 3 weeks  Dalia Heading, CNM

## 2019-03-28 ENCOUNTER — Other Ambulatory Visit: Payer: Self-pay

## 2019-03-28 ENCOUNTER — Ambulatory Visit (INDEPENDENT_AMBULATORY_CARE_PROVIDER_SITE_OTHER): Payer: BC Managed Care – PPO | Admitting: Certified Nurse Midwife

## 2019-03-28 ENCOUNTER — Ambulatory Visit (INDEPENDENT_AMBULATORY_CARE_PROVIDER_SITE_OTHER): Payer: BC Managed Care – PPO

## 2019-03-28 VITALS — BP 110/68 | Wt 125.0 lb

## 2019-03-28 DIAGNOSIS — Z1379 Encounter for other screening for genetic and chromosomal anomalies: Secondary | ICD-10-CM

## 2019-03-28 DIAGNOSIS — Z3A12 12 weeks gestation of pregnancy: Secondary | ICD-10-CM

## 2019-03-28 DIAGNOSIS — Z3401 Encounter for supervision of normal first pregnancy, first trimester: Secondary | ICD-10-CM

## 2019-03-28 DIAGNOSIS — Z3682 Encounter for antenatal screening for nuchal translucency: Secondary | ICD-10-CM

## 2019-03-28 DIAGNOSIS — Z3400 Encounter for supervision of normal first pregnancy, unspecified trimester: Secondary | ICD-10-CM

## 2019-03-28 DIAGNOSIS — Z363 Encounter for antenatal screening for malformations: Secondary | ICD-10-CM

## 2019-03-28 LAB — OB RESULTS CONSOLE VARICELLA ZOSTER ANTIBODY, IGG: Varicella: IMMUNE

## 2019-03-28 NOTE — Progress Notes (Signed)
ROB at 12 weeks for first trimester test and NOB labs:  CGA 12wk4d. NT 2.70mm +nasal bone First trimester lab and NOB labs done ROB in 4 weeks [facility] if desires at [place].  Farrel Conners, CNM

## 2019-03-28 NOTE — Progress Notes (Signed)
ROB/NT NO concerns Denies lof, no vb

## 2019-03-31 LAB — RPR+RH+ABO+RUB AB+AB SCR+CB...
Antibody Screen: NEGATIVE
HIV Screen 4th Generation wRfx: NONREACTIVE
Hematocrit: 37.1 % (ref 34.0–46.6)
Hemoglobin: 12.3 g/dL (ref 11.1–15.9)
Hepatitis B Surface Ag: NEGATIVE
MCH: 29.1 pg (ref 26.6–33.0)
MCHC: 33.2 g/dL (ref 31.5–35.7)
MCV: 88 fL (ref 79–97)
Platelets: 354 10*3/uL (ref 150–450)
RBC: 4.22 x10E6/uL (ref 3.77–5.28)
RDW: 12.3 % (ref 11.7–15.4)
RPR Ser Ql: NONREACTIVE
Rh Factor: POSITIVE
Rubella Antibodies, IGG: 2.78 index (ref >0.99)
Varicella zoster IgG: 351 index (ref >165)
WBC: 11.6 10*3/uL — ABNORMAL HIGH (ref 3.4–10.8)

## 2019-03-31 LAB — FIRST TRIMESTER SCREEN W/NT
CRL: 61.3 mm
DIA MoM: 0.78
DIA Value: 208.9 pg/mL
Gest Age-Collect: 12.4 weeks
Maternal Age At EDD: 22.9 yr
Nuchal Translucency MoM: 1.47
Nuchal Translucency: 2.1 mm
Number of Fetuses: 1
PAPP-A MoM: 0.46
PAPP-A Value: 515.3 ng/mL
Sonographer ID#: 83416
Test Results:: NEGATIVE
Weight: 125 [lb_av]
hCG MoM: 1.11
hCG Value: 115.8 IU/mL

## 2019-04-25 ENCOUNTER — Encounter: Payer: Self-pay | Admitting: Obstetrics and Gynecology

## 2019-04-25 ENCOUNTER — Other Ambulatory Visit: Payer: Self-pay

## 2019-04-25 ENCOUNTER — Ambulatory Visit (INDEPENDENT_AMBULATORY_CARE_PROVIDER_SITE_OTHER): Payer: BC Managed Care – PPO | Admitting: Obstetrics and Gynecology

## 2019-04-25 ENCOUNTER — Encounter: Payer: BC Managed Care – PPO | Admitting: Obstetrics and Gynecology

## 2019-04-25 VITALS — BP 118/74 | Wt 128.0 lb

## 2019-04-25 DIAGNOSIS — Z3402 Encounter for supervision of normal first pregnancy, second trimester: Secondary | ICD-10-CM

## 2019-04-25 DIAGNOSIS — Z3400 Encounter for supervision of normal first pregnancy, unspecified trimester: Secondary | ICD-10-CM

## 2019-04-25 DIAGNOSIS — Z3A16 16 weeks gestation of pregnancy: Secondary | ICD-10-CM

## 2019-04-25 NOTE — Progress Notes (Signed)
  Routine Prenatal Care Visit  Subjective  Jessica Waters is a 24 y.o. G1P0 at [redacted]w[redacted]d being seen today for ongoing prenatal care.  She is currently monitored for the following issues for this low-risk pregnancy and has Supervision of normal first pregnancy, antepartum and Allergy to alpha-gal on their problem list.  ----------------------------------------------------------------------------------- Patient reports no complaints.    . Vag. Bleeding: None.  Movement: Present. Leaking Fluid denies.  ----------------------------------------------------------------------------------- The following portions of the patient's history were reviewed and updated as appropriate: allergies, current medications, past family history, past medical history, past social history, past surgical history and problem list. Problem list updated.  Objective  Blood pressure 118/74, weight 128 lb (58.1 kg), last menstrual period 01/03/2019. Pregravid weight 125 lb (56.7 kg) Total Weight Gain 3 lb (1.361 kg) Urinalysis: Urine Protein    Urine Glucose    Fetal Status: Fetal Heart Rate (bpm): 135   Movement: Present     General:  Alert, oriented and cooperative. Patient is in no acute distress.  Skin: Skin is warm and dry. No rash noted.   Cardiovascular: Normal heart rate noted  Respiratory: Normal respiratory effort, no problems with respiration noted  Abdomen: Soft, gravid, appropriate for gestational age.       Pelvic:  Cervical exam deferred        Extremities: Normal range of motion.     Mental Status: Normal mood and affect. Normal behavior. Normal judgment and thought content.   Assessment   24 y.o. G1P0 at [redacted]w[redacted]d by  10/10/2019, by Last Menstrual Period presenting for routine prenatal visit  Plan   pregnancy Problems (from 02/19/19 to present)    No problems associated with this episode.       Preterm labor symptoms and general obstetric precautions including but not limited to vaginal bleeding,  contractions, leaking of fluid and fetal movement were reviewed in detail with the patient. Please refer to After Visit Summary for other counseling recommendations.   Return in about 3 weeks (around 05/16/2019) for anatomy u/s and routine prenatal.  Thomasene Mohair, MD, [facility], [facility] 04/25/2019 4:11 PM

## 2019-05-15 ENCOUNTER — Ambulatory Visit (INDEPENDENT_AMBULATORY_CARE_PROVIDER_SITE_OTHER): Payer: BC Managed Care – PPO | Admitting: Advanced Practice Midwife

## 2019-05-15 ENCOUNTER — Ambulatory Visit (INDEPENDENT_AMBULATORY_CARE_PROVIDER_SITE_OTHER): Payer: BC Managed Care – PPO

## 2019-05-15 ENCOUNTER — Encounter: Payer: Self-pay | Admitting: Advanced Practice Midwife

## 2019-05-15 ENCOUNTER — Other Ambulatory Visit: Payer: Self-pay

## 2019-05-15 VITALS — BP 116/74 | Wt 123.0 lb

## 2019-05-15 DIAGNOSIS — Z3A16 16 weeks gestation of pregnancy: Secondary | ICD-10-CM | POA: Diagnosis not present

## 2019-05-15 DIAGNOSIS — Z3400 Encounter for supervision of normal first pregnancy, unspecified trimester: Secondary | ICD-10-CM

## 2019-05-15 DIAGNOSIS — Z3A18 18 weeks gestation of pregnancy: Secondary | ICD-10-CM

## 2019-05-15 DIAGNOSIS — O321XX0 Maternal care for breech presentation, not applicable or unspecified: Secondary | ICD-10-CM | POA: Diagnosis not present

## 2019-05-15 DIAGNOSIS — Z363 Encounter for antenatal screening for malformations: Secondary | ICD-10-CM

## 2019-05-15 DIAGNOSIS — Z3402 Encounter for supervision of normal first pregnancy, second trimester: Secondary | ICD-10-CM

## 2019-05-15 NOTE — Progress Notes (Signed)
Anatomy scan today. No vb. No lof.  ?

## 2019-05-15 NOTE — Progress Notes (Addendum)
  Routine Prenatal Care Visit  Subjective  Jessica Waters is a 24 y.o. G1P0 at [redacted]w[redacted]d being seen today for ongoing prenatal care.  She is currently monitored for the following issues for this low-risk pregnancy and has Supervision of normal first pregnancy, antepartum and Allergy to alpha-gal on their problem list.  ----------------------------------------------------------------------------------- Patient reports round ligament pain. Discussed comfort measures. Discussed findings of anatomy scan and recommendations for follow up.  . Vag. Bleeding: None.  Movement: Present. Leaking Fluid denies.  ----------------------------------------------------------------------------------- The following portions of the patient's history were reviewed and updated as appropriate: allergies, current medications, past family history, past medical history, past social history, past surgical history and problem list. Problem list updated.  Objective  Blood pressure 116/74, weight 123 lb (55.8 kg), last menstrual period 01/03/2019. Pregravid weight 125 lb (56.7 kg) Total Weight Gain -2 lb (-0.907 kg) Urinalysis: Urine Protein    Urine Glucose    Fetal Status: Fetal Heart Rate (bpm): 148   Movement: Present      Anatomy scan: incomplete for placental cord insertion, low lying anterior placenta [1 cm], slightly low AFI: 9.2 cm  (Cardiac views sub optimal per Dr Ellie Lunch review of anatomy scan)   General:  Alert, oriented and cooperative. Patient is in no acute distress.  Skin: Skin is warm and dry. No rash noted.   Cardiovascular: Normal heart rate noted  Respiratory: Normal respiratory effort, no problems with respiration noted  Abdomen: Soft, gravid, appropriate for gestational age.       Pelvic:  Cervical exam deferred        Extremities: Normal range of motion.  Edema: None  Mental Status: Normal mood and affect. Normal behavior. Normal judgment and thought content.   Assessment   24 y.o. G1P0 at  [redacted]w[redacted]d by  10/10/2019, by Last Menstrual Period presenting for routine prenatal visit  Plan   pregnancy Problems (from 02/19/19 to present)    No problems associated with this episode.     Referral sent for anatomy f/u with [facility]  (message sent to [facility] regarding need to cancel scheduled f/u WS anatomy scan)   Preterm labor symptoms and general obstetric precautions including but not limited to vaginal bleeding, contractions, leaking of fluid and fetal movement were reviewed in detail with the patient.    Return in about 4 weeks (around 06/12/2019) for rob (anatomy f/u with M[facility]).    Tresea Mall, CNM 05/17/2019 9:38 AM

## 2019-05-17 NOTE — Addendum Note (Signed)
Addended by: Tresea Mall on: 05/17/2019 09:47 AM   Modules accepted: Orders

## 2019-05-19 ENCOUNTER — Other Ambulatory Visit: Payer: Self-pay | Admitting: Advanced Practice Midwife

## 2019-05-19 DIAGNOSIS — Z3689 Encounter for other specified antenatal screening: Secondary | ICD-10-CM

## 2019-05-26 ENCOUNTER — Ambulatory Visit: Payer: BC Managed Care – PPO

## 2019-05-29 ENCOUNTER — Other Ambulatory Visit: Payer: Self-pay

## 2019-05-29 ENCOUNTER — Ambulatory Visit
Admission: RE | Admit: 2019-05-29 | Discharge: 2019-05-29 | Disposition: A | Discharge status: Home / Self Care | Payer: BC Managed Care – PPO | Source: Ambulatory Visit | Attending: Obstetrics and Gynecology | Admitting: Obstetrics and Gynecology

## 2019-05-29 DIAGNOSIS — Z3A20 20 weeks gestation of pregnancy: Secondary | ICD-10-CM | POA: Diagnosis not present

## 2019-05-29 DIAGNOSIS — Z3689 Encounter for other specified antenatal screening: Secondary | ICD-10-CM | POA: Diagnosis present

## 2019-05-29 HISTORY — DX: Headache, unspecified: R51.9

## 2019-05-29 HISTORY — DX: Allergy to other foods: Z91.018

## 2019-06-12 ENCOUNTER — Other Ambulatory Visit: Payer: BC Managed Care – PPO

## 2019-06-12 ENCOUNTER — Encounter: Payer: Self-pay | Admitting: Obstetrics & Gynecology

## 2019-06-12 ENCOUNTER — Ambulatory Visit (INDEPENDENT_AMBULATORY_CARE_PROVIDER_SITE_OTHER): Payer: BC Managed Care – PPO | Admitting: Obstetrics & Gynecology

## 2019-06-12 ENCOUNTER — Other Ambulatory Visit: Payer: Self-pay

## 2019-06-12 VITALS — BP 100/60 | Wt 128.0 lb

## 2019-06-12 DIAGNOSIS — O4442 Low lying placenta NOS or without hemorrhage, second trimester: Secondary | ICD-10-CM

## 2019-06-12 DIAGNOSIS — O4440 Low lying placenta NOS or without hemorrhage, unspecified trimester: Secondary | ICD-10-CM

## 2019-06-12 DIAGNOSIS — Z3402 Encounter for supervision of normal first pregnancy, second trimester: Secondary | ICD-10-CM

## 2019-06-12 DIAGNOSIS — Z3A22 22 weeks gestation of pregnancy: Secondary | ICD-10-CM

## 2019-06-12 LAB — POCT URINALYSIS DIPSTICK OB
Glucose, UA: NEGATIVE
POC,PROTEIN,UA: NEGATIVE

## 2019-06-12 NOTE — Addendum Note (Signed)
Addended by: Cornelius Moras D on: 06/12/2019 04:32 PM   Modules accepted: Orders

## 2019-06-12 NOTE — Patient Instructions (Signed)

## 2019-06-12 NOTE — Progress Notes (Signed)
  Subjective  Fetal Movement? yes Contractions? no Leaking Fluid? no Vaginal Bleeding? no  Objective  BP 100/60   Wt 128 lb (58.1 kg)   LMP 01/03/2019 (Exact Date)   BMI 25.85 kg/m  General: NAD Pumonary: no increased work of breathing Abdomen: gravid, non-tender Extremities: no edema Psychiatric: mood appropriate, affect full  Assessment  24 y.o. G1P0 at [redacted]w[redacted]d by  10/10/2019, by Last Menstrual Period presenting for routine prenatal visit  Plan   Problem List Items Addressed This Visit    Supervision of normal first pregnancy, antepartum   [redacted] weeks gestation of pregnancy     -  PNV - Glucola nv   Low-lying placenta       Relevant Orders   US OB Follow Up nv     Annamarie Major, MD, [facility], [facility] 06/12/2019  4:28 PM

## 2019-07-11 ENCOUNTER — Ambulatory Visit (INDEPENDENT_AMBULATORY_CARE_PROVIDER_SITE_OTHER): Payer: BC Managed Care – PPO

## 2019-07-11 ENCOUNTER — Other Ambulatory Visit: Payer: Self-pay

## 2019-07-11 ENCOUNTER — Encounter: Payer: Self-pay | Admitting: Obstetrics & Gynecology

## 2019-07-11 ENCOUNTER — Ambulatory Visit (INDEPENDENT_AMBULATORY_CARE_PROVIDER_SITE_OTHER): Payer: BC Managed Care – PPO | Admitting: Obstetrics & Gynecology

## 2019-07-11 ENCOUNTER — Other Ambulatory Visit: Payer: BC Managed Care – PPO

## 2019-07-11 VITALS — BP 120/80 | Wt 136.0 lb

## 2019-07-11 DIAGNOSIS — Z3400 Encounter for supervision of normal first pregnancy, unspecified trimester: Secondary | ICD-10-CM

## 2019-07-11 DIAGNOSIS — Z131 Encounter for screening for diabetes mellitus: Secondary | ICD-10-CM

## 2019-07-11 DIAGNOSIS — Z3403 Encounter for supervision of normal first pregnancy, third trimester: Secondary | ICD-10-CM

## 2019-07-11 DIAGNOSIS — Z3A27 27 weeks gestation of pregnancy: Secondary | ICD-10-CM

## 2019-07-11 DIAGNOSIS — O4442 Low lying placenta NOS or without hemorrhage, second trimester: Secondary | ICD-10-CM | POA: Diagnosis not present

## 2019-07-11 DIAGNOSIS — Z113 Encounter for screening for infections with a predominantly sexual mode of transmission: Secondary | ICD-10-CM

## 2019-07-11 DIAGNOSIS — O4440 Low lying placenta NOS or without hemorrhage, unspecified trimester: Secondary | ICD-10-CM

## 2019-07-11 NOTE — Patient Instructions (Signed)
Third Trimester of Pregnancy The third trimester is from week 28 through week 40 (months 7 through 9). The third trimester is a time when the unborn baby (fetus) is growing rapidly. At the end of the ninth month, the fetus is about 20 inches in length and weighs 6-10 pounds. Body changes during your third trimester Your body will continue to go through many changes during pregnancy. The changes vary from woman to woman. During the third trimester:  Your weight will continue to increase. You can expect to gain 25-35 pounds (11-16 kg) by the end of the pregnancy.  You may begin to get stretch marks on your hips, abdomen, and breasts.  You may urinate more often because the fetus is moving lower into your pelvis and pressing on your bladder.  You may develop or continue to have heartburn. This is caused by increased hormones that slow down muscles in the digestive tract.  You may develop or continue to have constipation because increased hormones slow digestion and cause the muscles that push waste through your intestines to relax.  You may develop hemorrhoids. These are swollen veins (varicose veins) in the rectum that can itch or be painful.  You may develop swollen, bulging veins (varicose veins) in your legs.  You may have increased body aches in the pelvis, back, or thighs. This is due to weight gain and increased hormones that are relaxing your joints.  You may have changes in your hair. These can include thickening of your hair, rapid growth, and changes in texture. Some women also have hair loss during or after pregnancy, or hair that feels dry or thin. Your hair will most likely return to normal after your baby is born.  Your breasts will continue to grow and they will continue to become tender. A yellow fluid (colostrum) may leak from your breasts. This is the first milk you are producing for your baby.  Your belly button may stick out.  You may notice more swelling in your hands,  face, or ankles.  You may have increased tingling or numbness in your hands, arms, and legs. The skin on your belly may also feel numb.  You may feel short of breath because of your expanding uterus.  You may have more problems sleeping. This can be caused by the size of your belly, increased need to urinate, and an increase in your body's metabolism.  You may notice the fetus "dropping," or moving lower in your abdomen (lightening).  You may have increased vaginal discharge.  You may notice your joints feel loose and you may have pain around your pelvic bone. What to expect at prenatal visits You will have prenatal exams every 2 weeks until week 36. Then you will have weekly prenatal exams. During a routine prenatal visit:  You will be weighed to make sure you and the baby are growing normally.  Your blood pressure will be taken.  Your abdomen will be measured to track your baby's growth.  The fetal heartbeat will be listened to.  Any test results from the previous visit will be discussed.  You may have a cervical check near your due date to see if your cervix has softened or thinned (effaced).  You will be tested for Group B streptococcus. This happens between 35 and 37 weeks. Your health care provider may ask you:  What your birth plan is.  How you are feeling.  If you are feeling the baby move.  If you have had any abnormal   symptoms, such as leaking fluid, bleeding, severe headaches, or abdominal cramping.  If you are using any tobacco products, including cigarettes, chewing tobacco, and electronic cigarettes.  If you have any questions. Other tests or screenings that may be performed during your third trimester include:  Blood tests that check for low iron levels (anemia).  Fetal testing to check the health, activity level, and growth of the fetus. Testing is done if you have certain medical conditions or if there are problems during the pregnancy.  Nonstress test  (NST). This test checks the health of your baby to make sure there are no signs of problems, such as the baby not getting enough oxygen. During this test, a belt is placed around your belly. The baby is made to move, and its heart rate is monitored during movement. What is false labor? False labor is a condition in which you feel small, irregular tightenings of the muscles in the womb (contractions) that usually go away with rest, changing position, or drinking water. These are called Braxton Hicks contractions. Contractions may last for hours, days, or even weeks before true labor sets in. If contractions come at regular intervals, become more frequent, increase in intensity, or become painful, you should see your health care provider. What are the signs of labor?  Abdominal cramps.  Regular contractions that start at 10 minutes apart and become stronger and more frequent with time.  Contractions that start on the top of the uterus and spread down to the lower abdomen and back.  Increased pelvic pressure and dull back pain.  A watery or bloody mucus discharge that comes from the vagina.  Leaking of amniotic fluid. This is also known as your "water breaking." It could be a slow trickle or a gush. Let your health care provider know if it has a color or strange odor. If you have any of these signs, call your health care provider right away, even if it is before your due date. Follow these instructions at home: Medicines  Follow your health care provider's instructions regarding medicine use. Specific medicines may be either safe or unsafe to take during pregnancy.  Take a prenatal vitamin that contains at least 600 micrograms (mcg) of folic acid.  If you develop constipation, try taking a stool softener if your health care provider approves. Eating and drinking   Eat a balanced diet that includes fresh fruits and vegetables, whole grains, good sources of protein such as meat, eggs, or tofu,  and low-fat dairy. Your health care provider will help you determine the amount of weight gain that is right for you.  Avoid raw meat and uncooked cheese. These carry germs that can cause birth defects in the baby.  If you have low calcium intake from food, talk to your health care provider about whether you should take a daily calcium supplement.  Eat four or five small meals rather than three large meals a day.  Limit foods that are high in fat and processed sugars, such as fried and sweet foods.  To prevent constipation: ? Drink enough fluid to keep your urine clear or pale yellow. ? Eat foods that are high in fiber, such as fresh fruits and vegetables, whole grains, and beans. Activity  Exercise only as directed by your health care provider. Most women can continue their usual exercise routine during pregnancy. Try to exercise for 30 minutes at least 5 days a week. Stop exercising if you experience uterine contractions.  Avoid heavy lifting.  Do   not exercise in extreme heat or humidity, or at high altitudes.  Wear low-heel, comfortable shoes.  Practice good posture.  You may continue to have sex unless your health care provider tells you otherwise. Relieving pain and discomfort  Take frequent breaks and rest with your legs elevated if you have leg cramps or low back pain.  Take warm sitz baths to soothe any pain or discomfort caused by hemorrhoids. Use hemorrhoid cream if your health care provider approves.  Wear a good support bra to prevent discomfort from breast tenderness.  If you develop varicose veins: ? Wear support pantyhose or compression stockings as told by your healthcare provider. ? Elevate your feet for 15 minutes, 3-4 times a day. Prenatal care  Write down your questions. Take them to your prenatal visits.  Keep all your prenatal visits as told by your health care provider. This is important. Safety  Wear your seat belt at all times when driving.  Make  a list of emergency phone numbers, including numbers for family, friends, the hospital, and police and fire departments. General instructions  Avoid cat litter boxes and soil used by cats. These carry germs that can cause birth defects in the baby. If you have a cat, ask someone to clean the litter box for you.  Do not travel far distances unless it is absolutely necessary and only with the approval of your health care provider.  Do not use hot tubs, steam rooms, or saunas.  Do not drink alcohol.  Do not use any products that contain nicotine or tobacco, such as cigarettes and e-cigarettes. If you need help quitting, ask your health care provider.  Do not use any medicinal herbs or unprescribed drugs. These chemicals affect the formation and growth of the baby.  Do not douche or use tampons or scented sanitary pads.  Do not cross your legs for long periods of time.  To prepare for the arrival of your baby: ? Take prenatal classes to understand, practice, and ask questions about labor and delivery. ? Make a trial run to the hospital. ? Visit the hospital and tour the maternity area. ? Arrange for maternity or paternity leave through employers. ? Arrange for family and friends to take care of pets while you are in the hospital. ? Purchase a rear-facing car seat and make sure you know how to install it in your car. ? Pack your hospital bag. ? Prepare the baby's nursery. Make sure to remove all pillows and stuffed animals from the baby's crib to prevent suffocation.  Visit your dentist if you have not gone during your pregnancy. Use a soft toothbrush to brush your teeth and be gentle when you floss. Contact a health care provider if:  You are unsure if you are in labor or if your water has broken.  You become dizzy.  You have mild pelvic cramps, pelvic pressure, or nagging pain in your abdominal area.  You have lower back pain.  You have persistent nausea, vomiting, or  diarrhea.  You have an unusual or bad smelling vaginal discharge.  You have pain when you urinate. Get help right away if:  Your water breaks before 37 weeks.  You have regular contractions less than 5 minutes apart before 37 weeks.  You have a fever.  You are leaking fluid from your vagina.  You have spotting or bleeding from your vagina.  You have severe abdominal pain or cramping.  You have rapid weight loss or weight gain.  You have   shortness of breath with chest pain.  You notice sudden or extreme swelling of your face, hands, ankles, feet, or legs.  Your baby makes fewer than 10 movements in 2 hours.  You have severe headaches that do not go away when you take medicine.  You have vision changes. Summary  The third trimester is from week 28 through week 40, months 7 through 9. The third trimester is a time when the unborn baby (fetus) is growing rapidly.  During the third trimester, your discomfort may increase as you and your baby continue to gain weight. You may have abdominal, leg, and back pain, sleeping problems, and an increased need to urinate.  During the third trimester your breasts will keep growing and they will continue to become tender. A yellow fluid (colostrum) may leak from your breasts. This is the first milk you are producing for your baby.  False labor is a condition in which you feel small, irregular tightenings of the muscles in the womb (contractions) that eventually go away. These are called Braxton Hicks contractions. Contractions may last for hours, days, or even weeks before true labor sets in.  Signs of labor can include: abdominal cramps; regular contractions that start at 10 minutes apart and become stronger and more frequent with time; watery or bloody mucus discharge that comes from the vagina; increased pelvic pressure and dull back pain; and leaking of amniotic fluid. This information is not intended to replace advice given to you by your  health care provider. Make sure you discuss any questions you have with your health care provider. Document Revised: 06/27/2018 Document Reviewed: 04/11/2016 Elsevier Patient Education  2020 [employer]  

## 2019-07-11 NOTE — Progress Notes (Signed)
  Subjective  Fetal Movement? yes Contractions? no Leaking Fluid? no Vaginal Bleeding? no Occas BHs.  Feels she has lost her mucus plug. Objective  BP 120/80   Wt 136 lb (61.7 kg)   LMP 01/03/2019 (Exact Date)   BMI 27.47 kg/m  General: NAD Pumonary: no increased work of breathing Abdomen: gravid, non-tender Extremities: no edema Psychiatric: mood appropriate, affect full  Assessment  25 y.o. G1P0 at [redacted]w[redacted]d by  10/10/2019, by Last Menstrual Period presenting for routine prenatal visit  Plan   Problem List Items Addressed This Visit    Screening for diabetes mellitus     Relevant Orders   28 Week RH+Panel   [redacted] weeks gestation of pregnancy    PNV, FMC    Plans to breast feed    Unsure contraception pp       Encounter for supervision of normal first pregnancy in third trimester        The following were addressed during this visit:  Breastfeeding Education - Risks of giving your baby anything other than breast milk if you are breastfeeding  - Nonpharmacological pain relief methods for labor  - Rooming-in on a 24-hour basis  - Feeding on demand or baby-led feeding  - Frequent feeding to help assure optimal milk production  - Individualized Education   22-27 weeks - Childbirth Education  - Second Trimester Labs (RPR/Hgb-Hct/Plt/HIV)  - Fetal Growth and Movement    Jessica Major, MD, [facility], [facility] 07/11/2019  10:15 AM

## 2019-07-12 LAB — 28 WEEK RH+PANEL
Basophils Absolute: 0.1 10*3/uL (ref 0.0–0.2)
Basos: 1 %
EOS (ABSOLUTE): 0.2 10*3/uL (ref 0.0–0.4)
Eos: 2 %
Gestational Diabetes Screen: 91 mg/dL (ref 65–139)
HIV Screen 4th Generation wRfx: NONREACTIVE
Hematocrit: 30.7 % — ABNORMAL LOW (ref 34.0–46.6)
Hemoglobin: 10.4 g/dL — ABNORMAL LOW (ref 11.1–15.9)
Immature Grans (Abs): 0.2 10*3/uL — ABNORMAL HIGH (ref 0.0–0.1)
Immature Granulocytes: 1 %
Lymphocytes Absolute: 2.2 10*3/uL (ref 0.7–3.1)
Lymphs: 15 %
MCH: 30.7 pg (ref 26.6–33.0)
MCHC: 33.9 g/dL (ref 31.5–35.7)
MCV: 91 fL (ref 79–97)
Monocytes Absolute: 0.9 10*3/uL (ref 0.1–0.9)
Monocytes: 6 %
Neutrophils Absolute: 11.3 10*3/uL — ABNORMAL HIGH (ref 1.4–7.0)
Neutrophils: 75 %
Platelets: 377 10*3/uL (ref 150–450)
RBC: 3.39 x10E6/uL — ABNORMAL LOW (ref 3.77–5.28)
RDW: 12.5 % (ref 11.7–15.4)
RPR Ser Ql: NONREACTIVE
WBC: 14.9 10*3/uL — ABNORMAL HIGH (ref 3.4–10.8)

## 2019-07-17 ENCOUNTER — Other Ambulatory Visit: Payer: Self-pay

## 2019-07-17 ENCOUNTER — Ambulatory Visit (INDEPENDENT_AMBULATORY_CARE_PROVIDER_SITE_OTHER): Payer: BC Managed Care – PPO

## 2019-07-17 DIAGNOSIS — O99891 Other specified diseases and conditions complicating pregnancy: Secondary | ICD-10-CM | POA: Diagnosis not present

## 2019-07-17 DIAGNOSIS — M549 Dorsalgia, unspecified: Secondary | ICD-10-CM

## 2019-07-17 LAB — POCT URINALYSIS DIPSTICK
Bilirubin, UA: NEGATIVE
Blood, UA: NEGATIVE
Glucose, UA: NEGATIVE
Ketones, UA: NEGATIVE
Leukocytes, UA: NEGATIVE
Nitrite, UA: NEGATIVE
Protein, UA: NEGATIVE
Spec Grav, UA: 1.01 (ref 1.010–1.025)
Urobilinogen, UA: 0.2 E.U./dL
pH, UA: 5 (ref 5.0–8.0)

## 2019-07-30 ENCOUNTER — Ambulatory Visit (INDEPENDENT_AMBULATORY_CARE_PROVIDER_SITE_OTHER): Payer: BC Managed Care – PPO | Admitting: Obstetrics and Gynecology

## 2019-07-30 ENCOUNTER — Encounter: Payer: Self-pay | Admitting: Obstetrics and Gynecology

## 2019-07-30 ENCOUNTER — Other Ambulatory Visit: Payer: Self-pay

## 2019-07-30 VITALS — BP 110/64 | Wt 143.0 lb

## 2019-07-30 DIAGNOSIS — Z3A29 29 weeks gestation of pregnancy: Secondary | ICD-10-CM

## 2019-07-30 DIAGNOSIS — Z23 Encounter for immunization: Secondary | ICD-10-CM

## 2019-07-30 DIAGNOSIS — Z3403 Encounter for supervision of normal first pregnancy, third trimester: Secondary | ICD-10-CM

## 2019-07-30 DIAGNOSIS — Z3400 Encounter for supervision of normal first pregnancy, unspecified trimester: Secondary | ICD-10-CM

## 2019-07-30 LAB — POCT URINALYSIS DIPSTICK OB
Glucose, UA: NEGATIVE
POC,PROTEIN,UA: NEGATIVE

## 2019-07-30 NOTE — Progress Notes (Signed)
    Routine Prenatal Care Visit  Subjective  Jessica Waters is a 24 y.o. G1P0 at [redacted]w[redacted]d being seen today for ongoing prenatal care.  She is currently monitored for the following issues for this low-risk pregnancy and has Supervision of normal first pregnancy, antepartum and Allergy to alpha-gal on their problem list.  ----------------------------------------------------------------------------------- Patient reports no complaints.   Contractions: Irritability. Vag. Bleeding: None.  Movement: Present. Denies leaking of fluid.  ----------------------------------------------------------------------------------- The following portions of the patient's history were reviewed and updated as appropriate: allergies, current medications, past family history, past medical history, past social history, past surgical history and problem list. Problem list updated.   Objective  Blood pressure 110/64, weight 143 lb (64.9 kg), last menstrual period 01/03/2019. Pregravid weight 125 lb (56.7 kg) Total Weight Gain 18 lb (8.165 kg) Urinalysis:      Fetal Status: Fetal Heart Rate (bpm): 135 Fundal Height: 29 cm Movement: Present     General:  Alert, oriented and cooperative. Patient is in no acute distress.  Skin: Skin is warm and dry. No rash noted.   Cardiovascular: Normal heart rate noted  Respiratory: Normal respiratory effort, no problems with respiration noted  Abdomen: Soft, gravid, appropriate for gestational age. Pain/Pressure: Absent     Pelvic:  Cervical exam deferred        Extremities: Normal range of motion.  Edema: None  Mental Status: Normal mood and affect. Normal behavior. Normal judgment and thought content.     Assessment   24 y.o. G1P0 at [redacted]w[redacted]d by  10/10/2019, by Last Menstrual Period presenting for routine prenatal visit  Plan   pregnancy Problems (from 02/19/19 to present)    Problem Noted Resolved   Supervision of normal first pregnancy, antepartum 02/19/2019 by Tresea Mall, CNM No   Overview Addendum 07/30/2019  3:26 PM by Natale Milch, MD    Clinic [facility] Prenatal Labs  Dating LMP, [place] Blood type: O/Positive/-- (01/08 1541)   Genetic Screen 1 Screen: Neg Antibody:Negative (01/08 1541)  Anatomic [place] [facility], MFM nml Rubella: 2.78 (01/08 1541) Varicella: Imm  GTT 28 wk:  RPR: Non Reactive (01/08 1541)   Rhogam n/a HBsAg: Negative (01/08 1541)   Vaccines TDAP: 07/30/2019                   Flu Shot: Covid: Jan 2021 HIV: Non Reactive (01/08 1541)   Baby Food Breast                               GBS:   Contraception  Pap: 1st PAP 02/19/19  CBB  No   CS/VBAC NA   Support Person Husband [name]             Gestational age appropriate obstetric precautions including but not limited to vaginal bleeding, contractions, leaking of fluid and fetal movement were reviewed in detail with the patient.    Return in about 2 weeks (around 08/13/2019) for ROB in person.  Natale Milch MD [facility], [facility] 07/30/2019, 3:24 PM

## 2019-07-30 NOTE — Progress Notes (Signed)
Tdap & Blood transfusion consent today. No complaints

## 2019-07-31 ENCOUNTER — Other Ambulatory Visit: Payer: Self-pay

## 2019-07-31 ENCOUNTER — Encounter: Payer: Self-pay | Admitting: Obstetrics & Gynecology

## 2019-07-31 ENCOUNTER — Observation Stay
Admission: EM | Admit: 2019-07-31 | Discharge: 2019-07-31 | Disposition: A | Discharge status: Home / Self Care | Payer: BC Managed Care – PPO | Attending: Obstetrics & Gynecology | Admitting: Obstetrics & Gynecology

## 2019-07-31 DIAGNOSIS — W19XXXA Unspecified fall, initial encounter: Secondary | ICD-10-CM | POA: Diagnosis present

## 2019-07-31 DIAGNOSIS — Z0379 Encounter for other suspected maternal and fetal conditions ruled out: Principal | ICD-10-CM | POA: Insufficient documentation

## 2019-07-31 DIAGNOSIS — Z3A29 29 weeks gestation of pregnancy: Secondary | ICD-10-CM | POA: Diagnosis not present

## 2019-07-31 MED ORDER — ONDANSETRON HCL 4 MG/2ML IJ SOLN: 4.0000 mg | Freq: Four times a day (QID) | INTRAMUSCULAR | Status: DC | PRN

## 2019-07-31 MED ORDER — LIDOCAINE HCL (PF) 1 % IJ SOLN: 30.0000 mL | INTRAMUSCULAR | Status: DC | PRN

## 2019-07-31 MED ORDER — ACETAMINOPHEN 325 MG PO TABS
650.0000 mg | ORAL_TABLET | ORAL | Status: DC | PRN
Start: 2019-07-31 — End: 2019-08-01

## 2019-07-31 NOTE — OB Triage Note (Addendum)
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 Upon getting up she lost her balance and fell in to some desk causing her to lose her balance more, landing on her knees. Pt reports fetal movement. Pt is unsure if she hit her stomach. Pt reports soreness in the lower abdominal area. Pt reports no bleeding or fluid leakage. Pt reports no N/V/D.

## 2019-07-31 NOTE — Discharge Summary (Signed)
  See FPN 

## 2019-07-31 NOTE — Final Progress Note (Signed)
Physician Final Progress Note  Patient ID: PEYTAN ANDRINGA MRN: [id number] DOB/AGE: [birth year] 24 y.o.  Admit date: 07/31/2019 Admitting provider: Nadara Mustard, MD Discharge date: 07/31/2019  Admission Diagnoses: 29 weeks pregnancy Fall at work  Discharge Diagnoses:  Active Problems:   Fall   Pregnancy 29 weeks  Consults: None  Significant Findings/ Diagnostic Studies: Patient presented for evaluation of after reporting a fall forward onto knees at work at 1300 today.  No bleeding or abdominal pain.  Patient had exam by RN and this was reported to me. I reviewed her vital signs and fetal tracing, both of which were reassuring.  Patient was discharge as she was not laboring and no signs of trauma to abdomen or fetus.  No signs of abruption.  Procedures: A NST procedure was performed with FHR monitoring and a normal baseline established, appropriate time of 20-40 minutes of evaluation, and accels >2 seen w 10x10 characteristics.  Results show a REACTIVE NST.     Discharge Condition: good  Disposition: Discharge disposition: 01-Home or Self Care       Diet: Regular diet  Discharge Activity: Activity as tolerated  Discharge Instructions    Call MD for:   Complete by: As directed    Worsening contractions or pain; leakage of fluid; bleeding.   Diet general   Complete by: As directed    Increase activity slowly   Complete by: As directed      Allergies as of 07/31/2019      Reactions   Pork-derived Products Swelling   Face swelling and severe cramps   Beef-derived Products       Medication List    TAKE these medications   EPINEPHrine 0.3 mg/0.3 mL Soaj injection Commonly known as: EPI-PEN epinephrine 0.3 mg/0.3 mL injection, auto-injector   OVER THE COUNTER MEDICATION Take 1 capsule by mouth daily. Ashby Dawes made prenatal vitamin   venlafaxine XR 75 MG 24 hr capsule Commonly known as: EFFEXOR-XR Take 75 mg by mouth daily.        Total time spent taking  care of this patient: TRIAGE  Signed: Letitia Libra 07/31/2019, 9:24 PM

## 2019-08-13 ENCOUNTER — Other Ambulatory Visit: Payer: Self-pay

## 2019-08-13 ENCOUNTER — Ambulatory Visit (INDEPENDENT_AMBULATORY_CARE_PROVIDER_SITE_OTHER): Payer: BC Managed Care – PPO | Admitting: Obstetrics

## 2019-08-13 VITALS — BP 100/60 | Wt 149.0 lb

## 2019-08-13 DIAGNOSIS — Z3403 Encounter for supervision of normal first pregnancy, third trimester: Secondary | ICD-10-CM

## 2019-08-13 DIAGNOSIS — Z3400 Encounter for supervision of normal first pregnancy, unspecified trimester: Secondary | ICD-10-CM

## 2019-08-13 DIAGNOSIS — Z3A31 31 weeks gestation of pregnancy: Secondary | ICD-10-CM

## 2019-08-13 LAB — POCT URINALYSIS DIPSTICK OB
Glucose, UA: NEGATIVE
POC,PROTEIN,UA: NEGATIVE

## 2019-08-13 NOTE — Progress Notes (Signed)
  Routine Prenatal Care Visit  Subjective  Jessica Waters is a 24 y.o. G1P0 at [redacted]w[redacted]d being seen today for ongoing prenatal care.  She is currently monitored for the following issues for this low-risk pregnancy and has Supervision of normal first pregnancy, antepartum; Allergy to alpha-gal; and Fall on their problem list.  ----------------------------------------------------------------------------------- Patient reports no complaints.   Contractions: Not present. Vag. Bleeding: None.  Movement: Present. Leaking Fluid denies.  ----------------------------------------------------------------------------------- The following portions of the patient's history were reviewed and updated as appropriate: allergies, current medications, past family history, past medical history, past social history, past surgical history and problem list. Problem list updated.  Objective  Blood pressure 100/60, weight 149 lb (67.6 kg), last menstrual period 01/03/2019. Pregravid weight 125 lb (56.7 kg) Total Weight Gain 24 lb (10.9 kg) Urinalysis: Urine Protein Negative  Urine Glucose Negative  Fetal Status:     Movement: Present     General:  Alert, oriented and cooperative. Patient is in no acute distress.  Skin: Skin is warm and dry. No rash noted.   Cardiovascular: Normal heart rate noted  Respiratory: Normal respiratory effort, no problems with respiration noted  Abdomen: Soft, gravid, appropriate for gestational age. Pain/Pressure: Present     Pelvic:  Cervical exam deferred        Extremities: Normal range of motion.     Mental Status: Normal mood and affect. Normal behavior. Normal judgment and thought content.   Assessment   24 y.o. G1P0 at [redacted]w[redacted]d by  10/10/2019, by Last Menstrual Period presenting for routine prenatal visit  Plan   pregnancy Problems (from 02/19/19 to present)    Problem Noted Resolved   Supervision of normal first pregnancy, antepartum 02/19/2019 by Tresea Mall, CNM No   Overview Addendum 07/30/2019  3:26 PM by Natale Milch, MD    Clinic [facility] Prenatal Labs  Dating LMP, [place] Blood type: O/Positive/-- (01/08 1541)   Genetic Screen 1 Screen: Neg Antibody:Negative (01/08 1541)  Anatomic [place] [facility], MFM nml Rubella: 2.78 (01/08 1541) Varicella: Imm  GTT 28 wk:  RPR: Non Reactive (01/08 1541)   Rhogam n/a HBsAg: Negative (01/08 1541)   Vaccines TDAP: 07/30/2019                   Flu Shot: Covid: Jan 2021 HIV: Non Reactive (01/08 1541)   Baby Food Breast                               GBS:   Contraception  Pap: 1st PAP 02/19/19  CBB  No   CS/VBAC NA   Support Person Husband [name]              Preterm labor symptoms and general obstetric precautions including but not limited to vaginal bleeding, contractions, leaking of fluid and fetal movement were reviewed in detail with the patient. Please refer to After Visit Summary for other counseling recommendations.   Return in about 2 weeks (around 08/27/2019) for return OB.  Mirna Mires, CNM  08/13/2019 4:50 PM

## 2019-08-13 NOTE — Patient Instructions (Signed)
First Stage of Labor °Labor is your body's natural process of moving your baby and other structures, including the placenta and umbilical cord, out of your uterus. There are three stages of labor. How long each stage lasts is different for every woman. But certain events happen during each stage that are the same for everyone. °· The first stage starts when true labor begins. This stage ends when your cervix, which is the opening from your uterus into your vagina, is completely open (dilated). °· The second stage begins when your cervix is fully dilated and you start pushing. This stage ends when your baby is born. °· The third stage is the delivery of the organ that nourished your baby during pregnancy (placenta). °First stage of labor °As your due date gets closer, you may start to notice certain physical changes that mean labor is going to start soon. You may feel that your baby has dropped lower into your pelvis. You may experience irregular, often painless, contractions that go away when you walk around or lie down (Braxton Hicks contractions). This is also called false labor. °The first stage of labor begins when you start having contractions that come at regular (evenly spaced) intervals and your cervix starts to get thinner and wider in preparation for your baby to pass through. Birth care providers measure the dilation of your cervix in centimeters (cm). One centimeter is a little less than one-half of an inch. The first stage ends when your cervix is dilated to 10 cm. The first stage of labor is divided into three phases: °· Early phase. °· Active phase. °· Transitional phase. °The length of the first stage of labor varies. It may be longer if this is your first pregnancy. You may spend most of this stage at home trying to relax and stay comfortable. °How does this affect me? °During the first stage of labor, you will move through three phases. °What happens in the early phase? °· You will start to have  regular contractions that last 30-60 seconds. Contractions may come every 5-20 minutes. Keep track of your contractions and call your birth care provider. °· Your water may break during this phase. °· You may notice a clear or slightly bloody discharge of mucus (mucus plug) from your vagina. °· Your cervix will dilate to 3-6 cm. °What happens in the active phase? °The active phase usually lasts 3-5 hours. You may go to the hospital or birth center around this time. During the active phase: °· Your contractions will become stronger, longer, and more uncomfortable. °· Your contractions may last 45-90 seconds and come every 3-5 minutes. °· You may feel lower back pain. °· Your birth care providers may examine your cervix and feel your belly to find the position of your baby. °· You may have a monitor strapped to your belly to measure your contractions and your baby's heart rate. °· You may start using your pain management options. °· Your cervix may be dilated to 6 cm and may start to dilate more quickly. °What happens in the transitional phase? °The transitional phase typically lasts from 30 minutes to 2 hours. At the end of this phase, your cervix will be fully dilated to 10 cm. During the transitional phase: °· Contractions will get stronger and longer. °· Contractions may last 60-90 seconds and come less than 2 minutes apart. °· You may feel hot flashes, chills, or nausea. °How does this affect my baby? °During the first stage of labor, your baby will   gradually move down into your birth canal. °Follow these instructions at home and in the hospital or birth center: ° °· When labor first begins, try to stay calm. You are still in the early phase. If it is night, try to get some sleep. If it is day, try to relax and save your energy. You may want to make some calls and get ready to go to the hospital or birth center. °· When you are in the early phase, try these methods to help ease discomfort: °? Deep breathing and  muscle relaxation. °? Taking a walk. °? Taking a warm bath or shower. °· Drink some fluids and have a light snack if you feel like it. °· Keep track of your contractions. °· Based on the plan you created with your birth care provider, call when your contractions indicate it is time. °· If your water breaks, note the time, color, and odor of the fluid. °· When you are in the active phase, do your breathing exercises and rely on your support people and your team of birth care providers. °Contact a health care provider if: °· Your contractions are strong and regular. °· You have lower back pain or cramping. °· Your water breaks. °· You lose your mucus plug. °Get help right away if you: °· Have a severe headache that does not go away. °· Have changes in your vision. °· Have severe pain in your upper belly. °· Do not feel the baby move. °· Have bright red bleeding. °Summary °· The first stage of labor starts when true labor begins, and it ends when your cervix is dilated to 10 cm. °· The first stage of labor has three phases: early, active, and transitional. °· Your baby moves into the birth canal during the first stage of labor. °· You may have contractions that become stronger and longer. You may also lose your mucus plug and have your water break. °· Call your birth care provider when your contractions are frequent and strong enough to go to the hospital or birth center. °This information is not intended to replace advice given to you by your health care provider. Make sure you discuss any questions you have with your health care provider. °Document Revised: 06/27/2018 Document Reviewed: 05/20/2017 °Elsevier Patient Education © 2020 [employer] Inc[employer] ° °

## 2019-08-27 ENCOUNTER — Other Ambulatory Visit: Payer: Self-pay

## 2019-08-27 ENCOUNTER — Ambulatory Visit (INDEPENDENT_AMBULATORY_CARE_PROVIDER_SITE_OTHER): Payer: BC Managed Care – PPO | Admitting: Obstetrics

## 2019-08-27 VITALS — BP 120/80 | Wt 152.0 lb

## 2019-08-27 DIAGNOSIS — Z3400 Encounter for supervision of normal first pregnancy, unspecified trimester: Secondary | ICD-10-CM

## 2019-08-27 DIAGNOSIS — Z3A33 33 weeks gestation of pregnancy: Secondary | ICD-10-CM

## 2019-08-27 DIAGNOSIS — Z3403 Encounter for supervision of normal first pregnancy, third trimester: Secondary | ICD-10-CM

## 2019-08-27 LAB — POCT URINALYSIS DIPSTICK OB
Glucose, UA: NEGATIVE
POC,PROTEIN,UA: NEGATIVE

## 2019-08-27 NOTE — Progress Notes (Signed)
  Routine Prenatal Care Visit  Subjective  Jessica Waters is a 24 y.o. G1P0 at [redacted]w[redacted]d being seen today for ongoing prenatal care.  She is currently monitored for the following issues for this low-risk pregnancy and has Supervision of normal first pregnancy, antepartum; Allergy to alpha-gal; and Fall on their problem list.   She is going on vacation to the beach in about 10 days.  Continues taking daily iron tablets. ----------------------------------------------------------------------------------- Patient reports no complaints.   Contractions: Not present. Vag. Bleeding: None.  Movement: Present. Leaking Fluid denies.  ----------------------------------------------------------------------------------- The following portions of the patient's history were reviewed and updated as appropriate: allergies, current medications, past family history, past medical history, past social history, past surgical history and problem list. Problem list updated.  Objective  Blood pressure 120/80, weight 152 lb (68.9 kg), last menstrual period 01/03/2019. Pregravid weight 125 lb (56.7 kg) Total Weight Gain 27 lb (12.2 kg) Urinalysis: Urine Protein Negative  Urine Glucose Negative  Fetal Status:     Movement: Present     General:  Alert, oriented and cooperative. Patient is in no acute distress.  Skin: Skin is warm and dry. No rash noted.   Cardiovascular: Normal heart rate noted  Respiratory: Normal respiratory effort, no problems with respiration noted  Abdomen: Soft, gravid, appropriate for gestational age. Pain/Pressure: Present     Pelvic:  Cervical exam deferred        Extremities: Normal range of motion.     Mental Status: Normal mood and affect. Normal behavior. Normal judgment and thought content.   Assessment   24 y.o. G1P0 at [redacted]w[redacted]d by  10/10/2019, by Last Menstrual Period presenting for routine prenatal visit Anemia this pregnancy- on supplements Size+ dates  Plan   pregnancy Problems  (from 02/19/19 to present)    Problem Noted Resolved   Supervision of normal first pregnancy, antepartum 02/19/2019 by Tresea Mall, CNM No   Overview Addendum 07/30/2019  3:26 PM by Natale Milch, MD    [facility] Prenatal Labs  Dating LMP, [place] Blood type: O/Positive/-- (01/08 1541)   Genetic Screen 1 Screen: Neg Antibody:Negative (01/08 1541)  Anatomic [place] [facility], MFM nml Rubella: 2.78 (01/08 1541) Varicella: Imm  GTT 28 wk:  RPR: Non Reactive (01/08 1541)   Rhogam n/a HBsAg: Negative (01/08 1541)   Vaccines TDAP: 07/30/2019                   Flu Shot: Covid: Jan 2021 HIV: Non Reactive (01/08 1541)   Baby Food Breast                               GBS:   Contraception  Pap: 1st PAP 02/19/19  CBB  No   CS/VBAC NA   Support Person Husband [name]              Preterm labor symptoms and general obstetric precautions including but not limited to vaginal bleeding, contractions, leaking of fluid and fetal movement were reviewed in detail with the patient. Please refer to After Visit Summary for other counseling recommendations.   Return in about 2 weeks (around 09/10/2019) for return OB.  Will draw another CBC at 36 weeks to check her iron count.   Mirna Mires, CNM  08/27/2019 11:39 AM

## 2019-09-05 ENCOUNTER — Other Ambulatory Visit: Payer: Self-pay

## 2019-09-05 ENCOUNTER — Encounter: Payer: Self-pay | Admitting: Advanced Practice Midwife

## 2019-09-05 ENCOUNTER — Ambulatory Visit (INDEPENDENT_AMBULATORY_CARE_PROVIDER_SITE_OTHER): Payer: BC Managed Care – PPO | Admitting: Advanced Practice Midwife

## 2019-09-05 VITALS — BP 120/80 | Wt 155.0 lb

## 2019-09-05 DIAGNOSIS — Z3A35 35 weeks gestation of pregnancy: Secondary | ICD-10-CM

## 2019-09-05 DIAGNOSIS — Z3403 Encounter for supervision of normal first pregnancy, third trimester: Secondary | ICD-10-CM

## 2019-09-05 DIAGNOSIS — L299 Pruritus, unspecified: Secondary | ICD-10-CM

## 2019-09-05 LAB — POCT URINALYSIS DIPSTICK OB
Glucose, UA: NEGATIVE
POC,PROTEIN,UA: NEGATIVE

## 2019-09-05 NOTE — Progress Notes (Signed)
Routine Prenatal Care Visit  Subjective  Jessica Waters is a 24 y.o. G1P0 at [redacted]w[redacted]d being seen today for ongoing prenatal care.  She is currently monitored for the following issues for this low-risk pregnancy and has Supervision of normal first pregnancy, antepartum; Allergy to alpha-gal; and Fall on their problem list.  ----------------------------------------------------------------------------------- Patient reports general itching that started on her abdomen and is now "everywhere" starting a few days ago. She sent a message to the clinic and a plan was made to check bile acids. She has not eaten this morning. She tried benadryl which offered some relief and made her very sleepy.   Contractions: Not present. Vag. Bleeding: None.  Movement: Present. Leaking Fluid denies.  ----------------------------------------------------------------------------------- The following portions of the patient's history were reviewed and updated as appropriate: allergies, current medications, past family history, past medical history, past social history, past surgical history and problem list. Problem list updated.  Objective  Blood pressure 120/80, weight 155 lb (70.3 kg), last menstrual period 01/03/2019. Pregravid weight 125 lb (56.7 kg) Total Weight Gain 30 lb (13.6 kg) Urinalysis: Urine Protein    Urine Glucose    Fetal Status: Fetal Heart Rate (bpm): 141 Fundal Height: 36 cm Movement: Present     General:  Alert, oriented and cooperative. Patient is in no acute distress.  Skin: Skin is warm and dry. No rash noted.   Cardiovascular: Normal heart rate noted  Respiratory: Normal respiratory effort, no problems with respiration noted  Abdomen: Soft, gravid, appropriate for gestational age. Pain/Pressure: Present     Pelvic:  Cervical exam deferred        Extremities: Normal range of motion.  Edema: None  Mental Status: Normal mood and affect. Normal behavior. Normal judgment and thought content.    Assessment   24 y.o. G1P0 at [redacted]w[redacted]d by  10/10/2019, by Last Menstrual Period presenting for routine prenatal visit  Plan   pregnancy Problems (from 02/19/19 to present)    Problem Noted Resolved   Supervision of normal first pregnancy, antepartum 02/19/2019 by Tresea Mall, CNM No   Overview Addendum 08/27/2019 11:43 AM by Mirna Mires, CNM    [facility] Prenatal Labs  Dating LMP, [place] Blood type: O/Positive/-- (01/08 1541)   Genetic Screen 1 Screen: Neg Antibody:Negative (01/08 1541)  Anatomic [place] [facility], MFM nml Rubella: 2.78 (01/08 1541) Varicella: Imm  GTT 28 wk: 91 RPR: Non Reactive (01/08 1541)   Rhogam n/a HBsAg: Negative (01/08 1541)   Vaccines TDAP: 07/30/2019                   Flu Shot: Covid: Jan 2021 HIV: Non Reactive (01/08 1541)   Baby Food Breast                               GBS:   Contraception  Pap: 1st PAP 02/19/19  CBB  No   CS/VBAC NA   Support Person Husband [name]          Previous Version    Itching: Bile acids lab today, use gentle body care products and anti-itch lotion, aveeno bath  Preterm labor symptoms and general obstetric precautions including but not limited to vaginal bleeding, contractions, leaking of fluid and fetal movement were reviewed in detail with the patient. Please refer to After Visit Summary for other counseling recommendations.   Return in about 1 week (around 09/12/2019) for rob.  Tresea Mall, CNM 09/05/2019 11:24 AM

## 2019-09-05 NOTE — Patient Instructions (Signed)
Pain Relief During Labor and Delivery Many things can cause pain during labor and delivery, including:  Pressure on bones and ligaments due to the baby moving through the pelvis.  Stretching of tissues due to the baby moving through the birth canal.  Muscle tension due to anxiety or nervousness.  The uterus tightening (contracting) and relaxing to help move the baby. There are many ways to deal with the pain of labor and delivery. They include:  Taking prenatal classes. Taking these classes helps you know what to expect during your baby's birth. What you learn will increase your confidence and decrease your anxiety.  Practicing relaxation techniques or doing relaxing activities, such as: ? Focused breathing. ? Meditation. ? Visualization. ? Aroma therapy. ? Listening to your favorite music. ? Hypnosis.  Taking a warm shower or bath (hydrotherapy). This may: ? Provide comfort and relaxation. ? Lessen your perception of pain. ? Decrease the amount of pain medicine needed. ? Decrease the length of labor.  Getting a massage or counterpressure on your back.  Applying warm packs or ice packs.  Changing positions often, moving around, or using a birthing ball.  Getting: ? Pain medicine through an IV or injection into a muscle. ? Pain medicine inserted into your spinal column. ? Injections of sterile water just under the skin on your lower back (intradermal injections). ? Laughing gas (nitrous oxide). Discuss your pain control options with your health care provider during your prenatal visits. Explore the options offered by your hospital or birth center. What kinds of medicine are available? There are two kinds of medicines that can be used to relieve pain during labor and delivery:  Analgesics. These medicines decrease pain without causing you to lose feeling or the ability to move your muscles.  Anesthetics. These medicines block feeling in the body and can decrease your  ability to move freely. Both of these kinds of medicine can cause minor side effects, such as nausea, trouble concentrating, and sleepiness. They can also decrease the baby's heart rate before birth and affect the baby's breathing rate after birth. For this reason, health care providers are careful about when and how much medicine is given. What are specific medicines and procedures that provide pain relief? Local Anesthetics Local anesthetics are used to numb a small area of the body. They may be used along with another kind of anesthetic or used to numb the nerves of the vagina, cervix, and perineum during the second stage of labor. General Anesthetics General anesthetics cause you to lose consciousness so you do not feel pain. They are usually only used for an emergency cesarean delivery. General anesthetics are given through an IV tube and a mask. Pudendal Block A pudendal block is a form of local anesthetic. It may be used to relieve the pain associated with pushing or stretching of the perineum at the time of delivery or to further numb the perineum. A pudendal block is done by injecting numbing medicine through the vaginal wall into a nerve in the pelvis. Epidural Analgesia Epidural analgesia is given through a flexible IV catheter that is inserted into the lower back. Numbing medicine is delivered continuously to the area near your spinal column nerves (epidural space). After having this type of analgesia, you may be able to move your legs but you most likely will not be able to walk. Depending on the amount of medicine given, you may lose all feeling in the lower half of your body, or you may retain some level   of sensation, including the urge to push. Epidural analgesia can be used to provide pain relief for a vaginal birth. Spinal Block A spinal block is similar to epidural analgesia, but the medicine is injected into the spinal fluid instead of the epidural space. A spinal block is only given  once. It starts to relieve pain quickly, but the pain relief lasts only 1-6 hours. Spinal blocks can be used for cesarean deliveries. Combined Spinal-Epidural (CSE) Block A CSE block combines the effects of a spinal block and epidural analgesia. The spinal block works quickly to block all pain. The epidural analgesia provides continuous pain relief, even after the effects of the spinal block have worn off. This information is not intended to replace advice given to you by your health care provider. Make sure you discuss any questions you have with your health care provider. Document Revised: 02/16/2017 Document Reviewed: 07/28/2015 Elsevier Patient Education  2020 [employer] of the uterus can occur throughout pregnancy, but they are not always a sign that you are in labor. You may have practice contractions called Braxton Hicks contractions. These false labor contractions are sometimes confused with true labor. What are Jessica Waters contractions? Braxton Hicks contractions are tightening movements that occur in the muscles of the uterus before labor. Unlike true labor contractions, these contractions do not result in opening (dilation) and thinning of the cervix. Toward the end of pregnancy (32-34 weeks), Braxton Hicks contractions can happen more often and may become stronger. These contractions are sometimes difficult to tell apart from true labor because they can be very uncomfortable. You should not feel embarrassed if you go to the hospital with false labor. Sometimes, the only way to tell if you are in true labor is for your health care provider to look for changes in the cervix. The health care provider will do a physical exam and may monitor your contractions. If you are not in true labor, the exam should show that your cervix is not dilating and your water has not broken. If there are no other health problems associated with your pregnancy, it is  completely safe for you to be sent home with false labor. You may continue to have Braxton Hicks contractions until you go into true labor. How to tell the difference between true labor and false labor True labor  Contractions last 30-70 seconds.  Contractions become very regular.  Discomfort is usually felt in the top of the uterus, and it spreads to the lower abdomen and low back.  Contractions do not go away with walking.  Contractions usually become more intense and increase in frequency.  The cervix dilates and gets thinner. False labor  Contractions are usually shorter and not as strong as true labor contractions.  Contractions are usually irregular.  Contractions are often felt in the front of the lower abdomen and in the groin.  Contractions may go away when you walk around or change positions while lying down.  Contractions get weaker and are shorter-lasting as time goes on.  The cervix usually does not dilate or become thin. Follow these instructions at home:   Take over-the-counter and prescription medicines only as told by your health care provider.  Keep up with your usual exercises and follow other instructions from your health care provider.  Eat and drink lightly if you think you are going into labor.  If Braxton Hicks contractions are making you uncomfortable: ? Change your position from lying down or resting to walking,  or change from walking to resting. ? Sit and rest in a tub of warm water. ? Drink enough fluid to keep your urine pale yellow. Dehydration may cause these contractions. ? Do slow and deep breathing several times an hour.  Keep all follow-up prenatal visits as told by your health care provider. This is important. Contact a health care provider if:  You have a fever.  You have continuous pain in your abdomen. Get help right away if:  Your contractions become stronger, more regular, and closer together.  You have fluid leaking or  gushing from your vagina.  You pass blood-tinged mucus (bloody show).  You have bleeding from your vagina.  You have low back pain that you never had before.  You feel your baby's head pushing down and causing pelvic pressure.  Your baby is not moving inside you as much as it used to. Summary  Contractions that occur before labor are called Braxton Hicks contractions, false labor, or practice contractions.  Braxton Hicks contractions are usually shorter, weaker, farther apart, and less regular than true labor contractions. True labor contractions usually become progressively stronger and regular, and they become more frequent.  Manage discomfort from [facility] contractions by changing position, resting in a warm bath, drinking plenty of water, or practicing deep breathing. This information is not intended to replace advice given to you by your health care provider. Make sure you discuss any questions you have with your health care provider. Document Revised: 02/16/2017 Document Reviewed: 07/20/2016 Elsevier Patient Education  [place].

## 2019-09-07 LAB — BILE ACIDS, TOTAL: Bile Acids Total: 2.9 umol/L (ref 0.0–10.0)

## 2019-09-15 ENCOUNTER — Other Ambulatory Visit (HOSPITAL_COMMUNITY)
Admission: RE | Admit: 2019-09-15 | Discharge: 2019-09-15 | Disposition: A | Discharge status: Home / Self Care | Payer: BC Managed Care – PPO | Source: Ambulatory Visit | Attending: Advanced Practice Midwife | Admitting: Advanced Practice Midwife

## 2019-09-15 ENCOUNTER — Encounter: Payer: Self-pay | Admitting: Advanced Practice Midwife

## 2019-09-15 ENCOUNTER — Ambulatory Visit (INDEPENDENT_AMBULATORY_CARE_PROVIDER_SITE_OTHER): Payer: BC Managed Care – PPO | Admitting: Advanced Practice Midwife

## 2019-09-15 ENCOUNTER — Other Ambulatory Visit: Payer: Self-pay

## 2019-09-15 VITALS — BP 122/74 | Wt 160.0 lb

## 2019-09-15 DIAGNOSIS — Z113 Encounter for screening for infections with a predominantly sexual mode of transmission: Secondary | ICD-10-CM | POA: Insufficient documentation

## 2019-09-15 DIAGNOSIS — Z3A36 36 weeks gestation of pregnancy: Secondary | ICD-10-CM

## 2019-09-15 DIAGNOSIS — Z3403 Encounter for supervision of normal first pregnancy, third trimester: Secondary | ICD-10-CM

## 2019-09-15 DIAGNOSIS — Z3685 Encounter for antenatal screening for Streptococcus B: Secondary | ICD-10-CM

## 2019-09-15 LAB — OB RESULTS CONSOLE GC/CHLAMYDIA: Gonorrhea: NEGATIVE

## 2019-09-15 NOTE — Progress Notes (Signed)
°  Routine Prenatal Care Visit  Subjective  Jessica Waters is a 24 y.o. G1P0 at [redacted]w[redacted]d being seen today for ongoing prenatal care.  She is currently monitored for the following issues for this low-risk pregnancy and has Supervision of normal first pregnancy, antepartum; Allergy to alpha-gal; Fall; and Anxiety on their problem list.  ----------------------------------------------------------------------------------- Patient reports no complaints.   Contractions: Not present. Vag. Bleeding: None.  Movement: Present. Leaking Fluid denies.  ----------------------------------------------------------------------------------- The following portions of the patient's history were reviewed and updated as appropriate: allergies, current medications, past family history, past medical history, past social history, past surgical history and problem list. Problem list updated.  Objective  Blood pressure 122/74, weight 160 lb (72.6 kg), last menstrual period 01/03/2019. Pregravid weight 125 lb (56.7 kg) Total Weight Gain 35 lb (15.9 kg) Urinalysis: Urine Protein    Urine Glucose    Fetal Status: Fetal Heart Rate (bpm): 154 Fundal Height: 37 cm Movement: Present  Presentation: Vertex  General:  Alert, oriented and cooperative. Patient is in no acute distress.  Skin: Skin is warm and dry. No rash noted.   Cardiovascular: Normal heart rate noted  Respiratory: Normal respiratory effort, no problems with respiration noted  Abdomen: Soft, gravid, appropriate for gestational age. Pain/Pressure: Absent     Pelvic:  Cervical exam performed Dilation: 1 Effacement (%): 70 Station: -3  Extremities: Normal range of motion.  Edema: None  Mental Status: Normal mood and affect. Normal behavior. Normal judgment and thought content.   Assessment   24 y.o. G1P0 at [redacted]w[redacted]d by  10/10/2019, by Last Menstrual Period presenting for routine prenatal visit  Plan   pregnancy Problems (from 02/19/19 to present)    Problem Noted  Resolved   Supervision of normal first pregnancy, antepartum 02/19/2019 by Jessica Waters, CNM No   Overview Addendum 08/27/2019 11:43 AM by Jessica Waters, CNM    Clinic [facility] Prenatal Labs  Dating LMP, [place] Blood type: O/Positive/-- (01/08 1541)   Genetic Screen 1 Screen: Neg Antibody:Negative (01/08 1541)  Anatomic [place] [facility], MFM nml Rubella: 2.78 (01/08 1541) Varicella: Imm  GTT 28 wk: 91 RPR: Non Reactive (01/08 1541)   Rhogam n/a HBsAg: Negative (01/08 1541)   Vaccines TDAP: 07/30/2019                   Flu Shot: Covid: Jan 2021 HIV: Non Reactive (01/08 1541)   Baby Food Breast                               GBS:   Contraception  Pap: 1st PAP 02/19/19  CBB  No   CS/VBAC NA   Support Person Husband Jessica Waters          Previous Version       Preterm labor symptoms and general obstetric precautions including but not limited to vaginal bleeding, contractions, leaking of fluid and fetal movement were reviewed in detail with the patient. Please refer to After Visit Summary for other counseling recommendations.   Return in about 1 week (around 09/22/2019) for growth and rob.  Jessica Waters, CNM 09/15/2019 11:52 AM

## 2019-09-15 NOTE — Progress Notes (Signed)
No vb. No lof. GBS today.  

## 2019-09-16 LAB — CERVICOVAGINAL ANCILLARY ONLY
Chlamydia: NEGATIVE
Comment: NEGATIVE
Comment: NEGATIVE
Comment: NORMAL
Neisseria Gonorrhea: NEGATIVE
Trichomonas: NEGATIVE

## 2019-09-17 LAB — STREP GP B NAA: Strep Gp B NAA: NEGATIVE

## 2019-09-21 LAB — OB RESULTS CONSOLE RUBELLA ANTIBODY, IGM: Rubella: IMMUNE

## 2019-09-21 LAB — OB RESULTS CONSOLE VARICELLA ZOSTER ANTIBODY, IGG: Varicella: IMMUNE

## 2019-09-22 ENCOUNTER — Encounter: Payer: Self-pay | Admitting: Obstetrics and Gynecology

## 2019-09-22 ENCOUNTER — Other Ambulatory Visit: Payer: Self-pay

## 2019-09-22 ENCOUNTER — Inpatient Hospital Stay
Admission: EM | Admit: 2019-09-22 | Discharge: 2019-09-26 | DRG: 787 | Disposition: A | Discharge status: Home / Self Care | Payer: BC Managed Care – PPO | Attending: Obstetrics and Gynecology | Admitting: Obstetrics and Gynecology

## 2019-09-22 DIAGNOSIS — D62 Acute posthemorrhagic anemia: Secondary | ICD-10-CM | POA: Diagnosis not present

## 2019-09-22 DIAGNOSIS — O1404 Mild to moderate pre-eclampsia, complicating childbirth: Principal | ICD-10-CM | POA: Diagnosis present

## 2019-09-22 DIAGNOSIS — O99893 Other specified diseases and conditions complicating puerperium: Secondary | ICD-10-CM | POA: Diagnosis not present

## 2019-09-22 DIAGNOSIS — R000 Tachycardia, unspecified: Secondary | ICD-10-CM | POA: Diagnosis not present

## 2019-09-22 DIAGNOSIS — Z3A37 37 weeks gestation of pregnancy: Secondary | ICD-10-CM

## 2019-09-22 DIAGNOSIS — O9081 Anemia of the puerperium: Secondary | ICD-10-CM | POA: Diagnosis not present

## 2019-09-22 DIAGNOSIS — O1490 Unspecified pre-eclampsia, unspecified trimester: Secondary | ICD-10-CM

## 2019-09-22 DIAGNOSIS — Z20822 Contact with and (suspected) exposure to covid-19: Secondary | ICD-10-CM | POA: Diagnosis present

## 2019-09-22 DIAGNOSIS — O43123 Velamentous insertion of umbilical cord, third trimester: Secondary | ICD-10-CM | POA: Diagnosis present

## 2019-09-22 DIAGNOSIS — Z3400 Encounter for supervision of normal first pregnancy, unspecified trimester: Secondary | ICD-10-CM

## 2019-09-22 LAB — URINALYSIS, COMPLETE (UACMP) WITH MICROSCOPIC
Bilirubin Urine: NEGATIVE
Glucose, UA: NEGATIVE mg/dL
Hgb urine dipstick: NEGATIVE
Ketones, ur: NEGATIVE mg/dL
Leukocytes,Ua: NEGATIVE
Nitrite: NEGATIVE
Protein, ur: 30 mg/dL — AB
Specific Gravity, Urine: 1.021 (ref 1.005–1.030)
pH: 6 (ref 5.0–8.0)

## 2019-09-22 LAB — RUPTURE OF MEMBRANE (ROM)PLUS: Rom Plus: POSITIVE

## 2019-09-22 NOTE — OB Triage Note (Signed)
Pt is a 23y/o G1P0 at [redacted]w[redacted]d with c/o ctx and LOF. Pt state small gushes occ starting at 11AM and ctx began at 1530. Pt states +FM. Pt denies VB. Monitors applied and assessing. Initial FHT 150.

## 2019-09-23 ENCOUNTER — Encounter
Admission: EM | Disposition: A | Discharge status: Home / Self Care | Payer: Self-pay | Source: Home / Self Care | Attending: Obstetrics and Gynecology

## 2019-09-23 ENCOUNTER — Inpatient Hospital Stay: Payer: BC Managed Care – PPO | Admitting: Certified Registered Nurse Anesthetist

## 2019-09-23 DIAGNOSIS — O43123 Velamentous insertion of umbilical cord, third trimester: Secondary | ICD-10-CM | POA: Diagnosis present

## 2019-09-23 DIAGNOSIS — R000 Tachycardia, unspecified: Secondary | ICD-10-CM | POA: Diagnosis not present

## 2019-09-23 DIAGNOSIS — Z3A37 37 weeks gestation of pregnancy: Secondary | ICD-10-CM | POA: Diagnosis not present

## 2019-09-23 DIAGNOSIS — O1494 Unspecified pre-eclampsia, complicating childbirth: Secondary | ICD-10-CM | POA: Diagnosis not present

## 2019-09-23 DIAGNOSIS — O1490 Unspecified pre-eclampsia, unspecified trimester: Secondary | ICD-10-CM

## 2019-09-23 DIAGNOSIS — D62 Acute posthemorrhagic anemia: Secondary | ICD-10-CM | POA: Diagnosis not present

## 2019-09-23 DIAGNOSIS — O1404 Mild to moderate pre-eclampsia, complicating childbirth: Secondary | ICD-10-CM | POA: Diagnosis present

## 2019-09-23 DIAGNOSIS — Z20822 Contact with and (suspected) exposure to covid-19: Secondary | ICD-10-CM | POA: Diagnosis present

## 2019-09-23 DIAGNOSIS — O9081 Anemia of the puerperium: Secondary | ICD-10-CM | POA: Diagnosis not present

## 2019-09-23 DIAGNOSIS — O26893 Other specified pregnancy related conditions, third trimester: Secondary | ICD-10-CM | POA: Diagnosis present

## 2019-09-23 DIAGNOSIS — O99893 Other specified diseases and conditions complicating puerperium: Secondary | ICD-10-CM | POA: Diagnosis not present

## 2019-09-23 LAB — CBC WITH DIFFERENTIAL/PLATELET
Abs Immature Granulocytes: 0.22 10*3/uL — ABNORMAL HIGH (ref 0.00–0.07)
Basophils Absolute: 0.1 10*3/uL (ref 0.0–0.1)
Basophils Relative: 1 %
Eosinophils Absolute: 0.2 10*3/uL (ref 0.0–0.5)
Eosinophils Relative: 2 %
HCT: 30.8 % — ABNORMAL LOW (ref 36.0–46.0)
Hemoglobin: 10.7 g/dL — ABNORMAL LOW (ref 12.0–15.0)
Immature Granulocytes: 2 %
Lymphocytes Relative: 18 %
Lymphs Abs: 2.5 10*3/uL (ref 0.7–4.0)
MCH: 29.8 pg (ref 26.0–34.0)
MCHC: 34.7 g/dL (ref 30.0–36.0)
MCV: 85.8 fL (ref 80.0–100.0)
Monocytes Absolute: 1 10*3/uL (ref 0.1–1.0)
Monocytes Relative: 7 %
Neutro Abs: 9.9 10*3/uL — ABNORMAL HIGH (ref 1.7–7.7)
Neutrophils Relative %: 70 %
Platelets: 256 10*3/uL (ref 150–400)
RBC: 3.59 MIL/uL — ABNORMAL LOW (ref 3.87–5.11)
RDW: 15.9 % — ABNORMAL HIGH (ref 11.5–15.5)
WBC: 13.8 10*3/uL — ABNORMAL HIGH (ref 4.0–10.5)
nRBC: 0.2 % (ref 0.0–0.2)

## 2019-09-23 LAB — COMPREHENSIVE METABOLIC PANEL
ALT: 9 U/L (ref 0–44)
AST: 15 U/L (ref 15–41)
Albumin: 2.7 g/dL — ABNORMAL LOW (ref 3.5–5.0)
Alkaline Phosphatase: 138 U/L — ABNORMAL HIGH (ref 38–126)
Anion gap: 8 (ref 5–15)
BUN: 11 mg/dL (ref 6–20)
CO2: 23 mmol/L (ref 22–32)
Calcium: 9 mg/dL (ref 8.9–10.3)
Chloride: 103 mmol/L (ref 98–111)
Creatinine, Ser: 0.61 mg/dL (ref 0.44–1.00)
GFR calc Af Amer: >60 mL/min (ref >60)
GFR calc non Af Amer: >60 mL/min (ref >60)
Glucose, Bld: 89 mg/dL (ref 70–99)
Potassium: 4.3 mmol/L (ref 3.5–5.1)
Sodium: 134 mmol/L — ABNORMAL LOW (ref 135–145)
Total Bilirubin: 0.4 mg/dL (ref 0.3–1.2)
Total Protein: 6.2 g/dL — ABNORMAL LOW (ref 6.5–8.1)

## 2019-09-23 LAB — SARS CORONAVIRUS 2 BY RT PCR (HOSPITAL ORDER, PERFORMED IN ~~LOC~~ HOSPITAL LAB): SARS Coronavirus 2: NEGATIVE

## 2019-09-23 LAB — PROTEIN / CREATININE RATIO, URINE
Creatinine, Urine: 135 mg/dL
Protein Creatinine Ratio: 0.59 mg/mg{Cre} — ABNORMAL HIGH (ref 0.00–0.15)
Total Protein, Urine: 80 mg/dL

## 2019-09-23 LAB — ABO/RH: ABO/RH(D): O POS

## 2019-09-23 LAB — RPR: RPR Ser Ql: NONREACTIVE

## 2019-09-23 SURGERY — Surgical Case
Anesthesia: Epidural | Site: Abdomen

## 2019-09-23 MED ORDER — EPHEDRINE 5 MG/ML INJ
INTRAVENOUS | Status: AC
  Filled 2019-09-23: qty 10

## 2019-09-23 MED ORDER — TERBUTALINE SULFATE 1 MG/ML IJ SOLN: 0.2500 mg | Freq: Once | INTRAMUSCULAR | Status: DC | PRN

## 2019-09-23 MED ORDER — OXYTOCIN-SODIUM CHLORIDE 30-0.9 UT/500ML-% IV SOLN
2.5000 [IU]/h | INTRAVENOUS | Status: AC
  Administered 2019-09-24: 2.5 [IU]/h via INTRAVENOUS

## 2019-09-23 MED ORDER — OXYTOCIN-SODIUM CHLORIDE 30-0.9 UT/500ML-% IV SOLN
INTRAVENOUS | Status: DC | PRN
  Administered 2019-09-23: 399 mL via INTRAVENOUS
  Administered 2019-09-23: 1 mL via INTRAVENOUS

## 2019-09-23 MED ORDER — OXYCODONE HCL 5 MG PO TABS
5.0000 mg | ORAL_TABLET | ORAL | Status: DC | PRN
  Administered 2019-09-24: 5 mg via ORAL
  Administered 2019-09-24: 10 mg via ORAL
  Filled 2019-09-23: qty 2
  Filled 2019-09-23 (×2): qty 1

## 2019-09-23 MED ORDER — CEFAZOLIN SODIUM-DEXTROSE 2-4 GM/100ML-% IV SOLN
2.0000 g | INTRAVENOUS | Status: AC
  Administered 2019-09-23: 2 g via INTRAVENOUS
  Filled 2019-09-23: qty 100

## 2019-09-23 MED ORDER — LABETALOL HCL 5 MG/ML IV SOLN: 80.0000 mg | INTRAVENOUS | Status: DC | PRN

## 2019-09-23 MED ORDER — LACTATED RINGERS IV SOLN
INTRAVENOUS | Status: DC | PRN
Start: 2019-09-23 — End: 2019-09-23

## 2019-09-23 MED ORDER — BUTORPHANOL TARTRATE 1 MG/ML IJ SOLN
1.0000 mg | INTRAMUSCULAR | Status: DC | PRN
  Administered 2019-09-23 (×2): 1 mg via INTRAVENOUS
  Filled 2019-09-23 (×2): qty 1

## 2019-09-23 MED ORDER — FENTANYL 2.5 MCG/ML W/ROPIVACAINE 0.15% IN NS 100 ML EPIDURAL (ARMC)
EPIDURAL | Status: AC
  Filled 2019-09-23: qty 100

## 2019-09-23 MED ORDER — FENTANYL CITRATE (PF) 100 MCG/2ML IJ SOLN
INTRAMUSCULAR | Status: DC | PRN
  Administered 2019-09-23 (×2): 50 ug via INTRAVENOUS

## 2019-09-23 MED ORDER — OXYTOCIN-SODIUM CHLORIDE 30-0.9 UT/500ML-% IV SOLN
1.0000 m[IU]/min | INTRAVENOUS | Status: DC
  Administered 2019-09-23: 2 m[IU]/min via INTRAVENOUS
  Filled 2019-09-23: qty 500

## 2019-09-23 MED ORDER — LACTATED RINGERS IV SOLN
INTRAVENOUS | Status: DC
  Administered 2019-09-23 (×2): 1000 mL via INTRAVENOUS

## 2019-09-23 MED ORDER — KETOROLAC TROMETHAMINE 30 MG/ML IJ SOLN
INTRAMUSCULAR | Status: AC
  Filled 2019-09-23: qty 1

## 2019-09-23 MED ORDER — OXYCODONE-ACETAMINOPHEN 5-325 MG PO TABS: 2.0000 | ORAL_TABLET | ORAL | Status: DC | PRN

## 2019-09-23 MED ORDER — ONDANSETRON HCL 4 MG/2ML IJ SOLN: 4.0000 mg | Freq: Once | INTRAMUSCULAR | Status: DC | PRN

## 2019-09-23 MED ORDER — OXYTOCIN 10 UNIT/ML IJ SOLN
INTRAMUSCULAR | Status: AC
  Filled 2019-09-23: qty 2

## 2019-09-23 MED ORDER — LIDOCAINE-EPINEPHRINE (PF) 1.5 %-1:200000 IJ SOLN
INTRAMUSCULAR | Status: DC | PRN
  Administered 2019-09-23: 3 mL via PERINEURAL

## 2019-09-23 MED ORDER — FENTANYL CITRATE (PF) 100 MCG/2ML IJ SOLN
INTRAMUSCULAR | Status: AC
  Filled 2019-09-23: qty 2

## 2019-09-23 MED ORDER — FENTANYL 2.5 MCG/ML W/ROPIVACAINE 0.15% IN NS 100 ML EPIDURAL (ARMC)
EPIDURAL | Status: DC | PRN
  Administered 2019-09-23: 250 ug via EPIDURAL
  Administered 2019-09-23: 12 mL/h via EPIDURAL

## 2019-09-23 MED ORDER — OXYTOCIN BOLUS FROM INFUSION: 333.0000 mL | Freq: Once | INTRAVENOUS | Status: DC

## 2019-09-23 MED ORDER — FENTANYL CITRATE (PF) 100 MCG/2ML IJ SOLN
12.5000 ug | INTRAMUSCULAR | Status: DC | PRN
  Administered 2019-09-23: 12.5 ug via INTRAVENOUS

## 2019-09-23 MED ORDER — LIDOCAINE HCL (PF) 1 % IJ SOLN
INTRAMUSCULAR | Status: AC
  Filled 2019-09-23: qty 30

## 2019-09-23 MED ORDER — ONDANSETRON HCL 4 MG/2ML IJ SOLN
4.0000 mg | Freq: Four times a day (QID) | INTRAMUSCULAR | Status: DC | PRN
  Administered 2019-09-23 (×2): 4 mg via INTRAVENOUS
  Filled 2019-09-23 (×2): qty 2

## 2019-09-23 MED ORDER — KETOROLAC TROMETHAMINE 30 MG/ML IJ SOLN
INTRAMUSCULAR | Status: DC | PRN
  Administered 2019-09-23: 15 mg via INTRAVENOUS

## 2019-09-23 MED ORDER — MIDAZOLAM HCL 2 MG/2ML IJ SOLN
INTRAMUSCULAR | Status: AC
  Filled 2019-09-23: qty 2

## 2019-09-23 MED ORDER — BUPIVACAINE HCL (PF) 0.25 % IJ SOLN
INTRAMUSCULAR | Status: DC | PRN
  Administered 2019-09-23 (×2): 3 mL via EPIDURAL

## 2019-09-23 MED ORDER — BUPIVACAINE ON-Q PAIN PUMP (FOR ORDER SET NO CHG)
INJECTION | Status: DC
  Filled 2019-09-23: qty 1

## 2019-09-23 MED ORDER — LABETALOL HCL 5 MG/ML IV SOLN: 20.0000 mg | INTRAVENOUS | Status: DC | PRN

## 2019-09-23 MED ORDER — BUPIVACAINE HCL (PF) 0.5 % IJ SOLN
INTRAMUSCULAR | Status: DC | PRN
  Administered 2019-09-23: 10 mL

## 2019-09-23 MED ORDER — OXYTOCIN-SODIUM CHLORIDE 30-0.9 UT/500ML-% IV SOLN
1.0000 m[IU]/min | INTRAVENOUS | Status: DC
  Administered 2019-09-23: 10 m[IU]/min via INTRAVENOUS

## 2019-09-23 MED ORDER — LIDOCAINE HCL (PF) 2 % IJ SOLN
INTRAMUSCULAR | Status: AC
  Filled 2019-09-23: qty 5

## 2019-09-23 MED ORDER — PHENYLEPHRINE 40 MCG/ML (10ML) SYRINGE FOR IV PUSH (FOR BLOOD PRESSURE SUPPORT)
PREFILLED_SYRINGE | INTRAVENOUS | Status: DC | PRN
  Administered 2019-09-23: 120 ug via INTRAVENOUS
  Administered 2019-09-23: 100 ug via INTRAVENOUS

## 2019-09-23 MED ORDER — SOD CITRATE-CITRIC ACID 500-334 MG/5ML PO SOLN
30.0000 mL | ORAL | Status: DC | PRN
  Filled 2019-09-23: qty 30

## 2019-09-23 MED ORDER — MISOPROSTOL 200 MCG PO TABS
ORAL_TABLET | ORAL | Status: AC
  Filled 2019-09-23: qty 4

## 2019-09-23 MED ORDER — BUPIVACAINE HCL (PF) 0.5 % IJ SOLN
INTRAMUSCULAR | Status: AC
  Filled 2019-09-23: qty 30

## 2019-09-23 MED ORDER — LABETALOL HCL 5 MG/ML IV SOLN: 40.0000 mg | INTRAVENOUS | Status: DC | PRN

## 2019-09-23 MED ORDER — MIDAZOLAM HCL 2 MG/2ML IJ SOLN
INTRAMUSCULAR | Status: DC | PRN
  Administered 2019-09-23 (×2): 1 mg via INTRAVENOUS

## 2019-09-23 MED ORDER — FENTANYL CITRATE (PF) 100 MCG/2ML IJ SOLN
25.0000 ug | INTRAMUSCULAR | Status: DC | PRN
  Administered 2019-09-23: 25 ug via INTRAVENOUS
  Filled 2019-09-23: qty 2

## 2019-09-23 MED ORDER — AMMONIA AROMATIC IN INHA
RESPIRATORY_TRACT | Status: AC
  Filled 2019-09-23: qty 10

## 2019-09-23 MED ORDER — CHLOROPROCAINE HCL (PF) 3 % IJ SOLN
INTRAMUSCULAR | Status: DC | PRN
  Administered 2019-09-23: 4 mL
  Administered 2019-09-23 (×3): 5 mL

## 2019-09-23 MED ORDER — ONDANSETRON HCL 4 MG/2ML IJ SOLN
INTRAMUSCULAR | Status: DC | PRN
  Administered 2019-09-23: 4 mg via INTRAVENOUS

## 2019-09-23 MED ORDER — LIDOCAINE HCL (PF) 1 % IJ SOLN
INTRAMUSCULAR | Status: DC | PRN
  Administered 2019-09-23: 3 mL via SUBCUTANEOUS

## 2019-09-23 MED ORDER — OXYCODONE-ACETAMINOPHEN 5-325 MG PO TABS: 1.0000 | ORAL_TABLET | ORAL | Status: DC | PRN

## 2019-09-23 MED ORDER — SODIUM CHLORIDE 0.9 % IV SOLN
INTRAVENOUS | Status: DC | PRN
  Administered 2019-09-23: 12 mL/h via INTRAVENOUS

## 2019-09-23 MED ORDER — BUPIVACAINE 0.25 % ON-Q PUMP DUAL CATH 400 ML
400.0000 mL | INJECTION | Status: DC
  Filled 2019-09-23: qty 400

## 2019-09-23 MED ORDER — MORPHINE SULFATE (PF) 0.5 MG/ML IJ SOLN
INTRAMUSCULAR | Status: AC
  Filled 2019-09-23: qty 10

## 2019-09-23 MED ORDER — LIDOCAINE HCL (PF) 2 % IJ SOLN
INTRAMUSCULAR | Status: DC | PRN
  Administered 2019-09-23: 80 mg via EPIDURAL

## 2019-09-23 MED ORDER — PROPOFOL 10 MG/ML IV BOLUS
INTRAVENOUS | Status: AC
  Filled 2019-09-23: qty 20

## 2019-09-23 MED ORDER — SOD CITRATE-CITRIC ACID 500-334 MG/5ML PO SOLN
30.0000 mL | ORAL | Status: AC
  Administered 2019-09-23: 30 mL via ORAL

## 2019-09-23 MED ORDER — ACETAMINOPHEN 325 MG PO TABS: 650.0000 mg | ORAL_TABLET | ORAL | Status: DC | PRN

## 2019-09-23 MED ORDER — OXYTOCIN-SODIUM CHLORIDE 30-0.9 UT/500ML-% IV SOLN
2.5000 [IU]/h | INTRAVENOUS | Status: DC
  Filled 2019-09-23: qty 500

## 2019-09-23 MED ORDER — LIDOCAINE HCL (PF) 1 % IJ SOLN: 30.0000 mL | INTRAMUSCULAR | Status: DC | PRN

## 2019-09-23 MED ORDER — LACTATED RINGERS IV SOLN
500.0000 mL | INTRAVENOUS | Status: DC | PRN
  Administered 2019-09-23: 500 mL via INTRAVENOUS

## 2019-09-23 SURGICAL SUPPLY — 29 items
CANISTER SUCT 3000ML PPV (MISCELLANEOUS) ×3 IMPLANT
CHLORAPREP W/TINT 26 (MISCELLANEOUS) ×6 IMPLANT
DERMABOND ADVANCED (GAUZE/BANDAGES/DRESSINGS) ×4
DERMABOND ADVANCED .7 DNX12 (GAUZE/BANDAGES/DRESSINGS) ×2 IMPLANT
DRSG OPSITE POSTOP 4X10 (GAUZE/BANDAGES/DRESSINGS) ×3 IMPLANT
ELECT CAUTERY BLADE 6.4 (BLADE) ×3 IMPLANT
ELECT REM PT RETURN 9FT ADLT (ELECTROSURGICAL) ×3
ELECTRODE REM PT RTRN 9FT ADLT (ELECTROSURGICAL) ×1 IMPLANT
GLOVE BIOGEL PI IND STRL 6.5 (GLOVE) ×3 IMPLANT
GLOVE BIOGEL PI INDICATOR 6.5 (GLOVE) ×6
GLOVE SURG SYN 7.5  E (GLOVE) ×6
GLOVE SURG SYN 7.5 E (GLOVE) ×3 IMPLANT
GOWN STRL REUS W/ TWL LRG LVL3 (GOWN DISPOSABLE) ×1 IMPLANT
GOWN STRL REUS W/ TWL XL LVL3 (GOWN DISPOSABLE) ×2 IMPLANT
GOWN STRL REUS W/TWL LRG LVL3 (GOWN DISPOSABLE) ×2
GOWN STRL REUS W/TWL XL LVL3 (GOWN DISPOSABLE) ×4
NS IRRIG 1000ML POUR BTL (IV SOLUTION) ×3 IMPLANT
PACK C SECTION (MISCELLANEOUS) ×3 IMPLANT
PAD OB MATERNITY 4.3X12.25 (PERSONAL CARE ITEMS) ×6 IMPLANT
PAD PREP 24X41 OB/GYN DISP (PERSONAL CARE ITEMS) ×3 IMPLANT
PENCIL SMOKE ULTRAEVAC 22 CON (MISCELLANEOUS) ×3 IMPLANT
SPONGE LAP 18X18 RF (DISPOSABLE) ×6 IMPLANT
SUT CHROMIC 0 CT 1 (SUTURE) ×3 IMPLANT
SUT CHROMIC 3 0 SH 27 (SUTURE) ×3 IMPLANT
SUT MNCRL AB 4-0 PS2 18 (SUTURE) ×3 IMPLANT
SUT PLAIN 3-0 (SUTURE) ×3 IMPLANT
SUT VIC AB 0 CT1 36 (SUTURE) ×9 IMPLANT
SUT VIC AB 2-0 CT1 36 (SUTURE) ×3 IMPLANT
SYR 30ML LL (SYRINGE) ×6 IMPLANT

## 2019-09-23 NOTE — Progress Notes (Signed)
Fetal bradycardia to the 90-110s for 10 minutes Patient has made quick cervical change from 4-7.  SVE: 7/90/0 FSE placed easily.  Pitocin discontinued Patient sitting upright in bed.  Fetal heart tomes have improved.  Adelene Idler MD, [facility], [facility] 09/23/2019 1:20 PM

## 2019-09-23 NOTE — Discharge Instructions (Signed)
Discharge Instructions:   Follow-up Appointment: 1 week, please call the office to schedule  If there are any new medications, they have been ordered and will be available for pickup at the listed pharmacy on your way home from the hospital.   Call the office if you have any of the following: headache, visual changes, fever >101.0 F, chills, shortness of breath, breast concerns, excessive vaginal bleeding, incision drainage or problems, leg pain or redness, depression or any other concerns. If you have vaginal discharge with an odor, let your doctor know.   It is normal to bleed for up to 6 weeks. You should not soak through more than 1 pad in 1 hour. If you have a blood clot larger than your fist with continued bleeding, call your doctor.   After a c-section, you should expect a small amount of blood or clear fluid coming from the incision and abdominal cramping/soreness. Inspect your incision site daily. Stand in front of a mirror to look for any redness, incision opening, or discolored/odorness drainage. Take a shower daily and continue good hygiene. Use own towel and washcloth (do not share). Make sure your sheets on your bed are clean. No pets sleeping around your incision site. Dressing will be removed at your postpartum visit. If the dressing does become wet or soiled underneath, it is okay to remove it before your visit.    On-Q pump: You will remove on day 4 after insertion or if the ball becomes flat before day 4. You will remove on: 09/27/2019  Activity: Do not lift > 15 lbs for 6 weeks (do not lift anything heavier than your baby). No intercourse, tampons, swimming pools, hot tubs, baths (only showers) for 6 weeks.  No driving for 1-2 weeks. Do not drive while taking narcotic or opioid pain medication.  Continue taking your prenatal vitamin, especially if breastfeeding. Increase calories and fluids (water) while breastfeeding.   Your milk will come in, in the next couple of days (right  now it is colostrum). You may have a slight fever when your milk comes in, but it should go away on its own.  If it does not, and rises above 101 F please call the doctor. You will also feel achy and your breasts will be firm. They will also start to leak. If you are breastfeeding, continue as you have been and you can pump/express milk for comfort.   If you have too much milk, your breasts can become engorged, which could lead to mastitis. This is an infection of the milk ducts. It can be very painful and you will need to notify your doctor to obtain a prescription for antibiotics. You can also treat it with a shower or hot/cold compress.   For concerns about your baby, please call your pediatrician.  For breastfeeding concerns, the lactation consultant can be reached at [phone].   Postpartum blues (feelings of happy one minute and sad another minute) are normal for the first few weeks but if it gets worse let your doctor know.   Congratulations! We enjoyed caring for you and your new bundle of joy!   

## 2019-09-23 NOTE — Anesthesia Preprocedure Evaluation (Signed)
Anesthesia Evaluation  Patient identified by MRN, date of birth, ID band Patient awake    Reviewed: Allergy & Precautions, H&P , NPO status , Patient's Chart, lab work & pertinent test results  Airway Mallampati: II   Neck ROM: full    Dental no notable dental hx.    Pulmonary neg pulmonary ROS,    Pulmonary exam normal        Cardiovascular hypertension, Normal cardiovascular exam     Neuro/Psych  Headaches, Anxiety    GI/Hepatic Neg liver ROS, GERD  Controlled,  Endo/Other  negative endocrine ROS  Renal/GU negative Renal ROS  negative genitourinary   Musculoskeletal   Abdominal   Peds  Hematology negative hematology ROS (+)   Anesthesia Other Findings   Reproductive/Obstetrics (+) Pregnancy                             Anesthesia Physical Anesthesia Plan  ASA: II  Anesthesia Plan: Epidural   Post-op Pain Management:    Induction:   PONV Risk Score and Plan:   Airway Management Planned:   Additional Equipment:   Intra-op Plan:   Post-operative Plan:   Informed Consent: I have reviewed the patients History and Physical, chart, labs and discussed the procedure including the risks, benefits and alternatives for the proposed anesthesia with the patient or authorized representative who has indicated his/her understanding and acceptance.     Dental Advisory Given  Plan Discussed with: Anesthesiologist and CRNA  Anesthesia Plan Comments:         Anesthesia Quick Evaluation

## 2019-09-23 NOTE — Progress Notes (Signed)
Note for 9:40 SVE: 3/90/-3 AROM, meconium. IUPC placed easily. Continue with pitocin and IOL for preeclampsia Epidural PRN.   Adelene Idler MD, [facility], [facility] 09/23/2019 10:41 AM

## 2019-09-23 NOTE — Discharge Summary (Signed)
Postpartum Discharge Summary  Date of Service updated 09/26/2019     Patient Name: Jessica Waters DOB: 1996/02/10 MRN: [id number]  Date of admission: 09/22/2019 Delivery date:09/23/2019  Delivering provider: Adrian Prows R  Date of discharge: 09/26/2019  Admitting diagnosis: [redacted] weeks gestation of pregnancy [Z3A.37] Intrauterine pregnancy: [redacted]w[redacted]d    Secondary diagnosis:  Active Problems:   Pre-eclampsia   Cesarean delivery delivered   Encounter for care or examination of lactating mother  Additional problems: Arrest of decent, Meconium    Discharge diagnosis: Term Pregnancy Delivered                                              Post partum procedures:blood transfusion Augmentation: AROM and Pitocin Complications: [id number] Hospital course: Induction of Labor With Cesarean Section   24y.o. yo G1P0 at [id number] admitted to the hospital 09/22/2019 for induction of labor. Patient had a labor course significant for rupture of membranes and pushing for over 2 hours without descent of the fetal head. The patient went for cesarean section due to Arrest of Descent. Delivery details are as follows: Membrane Rupture Time/Date: 9:40 AM ,09/23/2019   Delivery Method:C-Section, Low Transverse  Details of operation can be found in separate operative Note.  Patient had an uncomplicated postpartum course. She is ambulating, tolerating a regular diet, passing flatus, and urinating well.  Patient is discharged home in stable condition on 09/26/19.      Newborn Data: Birth date:09/23/2019  Birth time:9:44 PM  Gender:Female  Living status:Living  Apgars:8 ,9  Weight:3530 g                                 Magnesium Sulfate received: No BMZ received: No Rhophylac:No MMR:No T-DaP:Given prenatally Flu: No Transfusion:Yes  Physical exam  Vitals:   09/25/19 1925 09/25/19 2332 09/26/19 0537 09/26/19 0854  BP: 124/88 134/87 (!) 151/94 (!) 135/93  Pulse: 93 (!) 103 96 95  Resp: '18 18  18 18  ' Temp: 98 F (36.7 C) 98.3 F (36.8 C) 98.1 F (36.7 C) 98.3 F (36.8 C)  TempSrc: Oral Oral Oral Oral  SpO2: 98% 97% 100% 98%  Weight:      Height:       General: alert, cooperative and no distress Lochia: appropriate Uterine Fundus: firm Incision: Healing well with no significant drainage, Dressing is clean, dry, and intact, On Q pump intact DVT Evaluation: No evidence of DVT seen on physical exam. Labs: Lab Results  Component Value Date   WBC 15.8 (H) 09/25/2019   HGB 8.1 (L) 09/25/2019   HCT 23.4 (L) 09/25/2019   MCV 89.7 09/25/2019   PLT 255 09/25/2019   CMP Latest Ref Rng & Units 09/25/2019  Glucose 70 - 99 mg/dL 79  BUN 6 - 20 mg/dL 10  Creatinine 0.44 - 1.00 mg/dL 0.61  Sodium 135 - 145 mmol/L 138  Potassium 3.5 - 5.1 mmol/L 4.3  Chloride 98 - 111 mmol/L 108  CO2 22 - 32 mmol/L 24  Calcium 8.9 - 10.3 mg/dL 8.0(L)  Total Protein 6.5 - 8.1 g/dL 5.1(L)  Total Bilirubin 0.3 - 1.2 mg/dL 0.3  Alkaline Phos 38 - 126 U/L 87  AST 15 - 41 U/L 18  ALT 0 - 44 U/L 10   Edinburgh Score:  Edinburgh Postnatal Depression Scale Screening Tool 09/24/2019  I have been able to laugh and see the funny side of things. 0  I have looked forward with enjoyment to things. 0  I have blamed myself unnecessarily when things went wrong. 0  I have been anxious or worried for no good reason. 2  I have felt scared or panicky for no good reason. 1  Things have been getting on top of me. 1  I have been so unhappy that I have had difficulty sleeping. 0  I have felt sad or miserable. 1  I have been so unhappy that I have been crying. 0  The thought of harming myself has occurred to me. 0  Edinburgh Postnatal Depression Scale Total 5      After visit meds:  Allergies as of 09/26/2019      Reactions   Pork-derived Products Swelling   Face swelling and severe cramps   Beef-derived Products       Medication List    STOP taking these medications   EPINEPHrine 0.3 mg/0.3 mL Soaj  injection Commonly known as: EPI-PEN     TAKE these medications   labetalol 100 MG tablet Commonly known as: NORMODYNE Take 1 tablet (100 mg total) by mouth 2 (two) times daily.   OVER THE COUNTER MEDICATION Take 1 capsule by mouth daily. Nature made prenatal vitamin   oxyCODONE 5 MG immediate release tablet Commonly known as: Oxy IR/ROXICODONE Take 1 tablet (5 mg total) by mouth every 6 (six) hours as needed for up to 5 days for moderate pain or severe pain.   venlafaxine XR 75 MG 24 hr capsule Commonly known as: EFFEXOR-XR Take 75 mg by mouth daily.            Discharge Care Instructions  (From admission, onward)         Start     Ordered   09/26/19 0000  Discharge wound care:       Comments: Keep incision dry, clean.   09/26/19 0940           Discharge home in stable condition Infant Feeding: Breast Infant Disposition:home with mother Discharge instruction: per After Visit Summary and Postpartum booklet. Activity: Advance as tolerated. Pelvic rest for 6 weeks.  Diet: routine diet Anticipated Birth Control: Unsure Postpartum Appointment:1 week Additional Postpartum F/U: BP check Future Appointments: No future appointments. Follow up Visit:  Follow-up Information    Schuman, Stefanie Libel, MD. Go in 1 week(s).   Specialty: Obstetrics and Gynecology Why: For incision and BP check Contact information: [address] [place] [address] [place], [place] [facility] 09/26/2019, 9:44 AM

## 2019-09-23 NOTE — Transfer of Care (Signed)
Immediate Anesthesia Transfer of Care Note  Patient: Jessica Waters  Procedure(s) Performed: CESAREAN SECTION (N/A Abdomen)  Patient Location: PACU  Anesthesia Type:Epidural  Level of Consciousness: awake, alert , oriented and patient cooperative  Airway & Oxygen Therapy: Patient Spontanous Breathing  Post-op Assessment: Report given to RN and Post -op Vital signs reviewed and stable  Post vital signs: Reviewed and stable  Last Vitals:  Vitals Value Taken Time  BP 134/93 09/23/19 2252  Temp 36.8 C 09/23/19 2252  Pulse 114 09/23/19 2252  Resp 18 09/23/19 2252  SpO2      Last Pain:  Vitals:   09/23/19 2252  TempSrc: Oral  PainSc:          Complications: No complications documented.

## 2019-09-23 NOTE — Anesthesia Procedure Notes (Signed)
Epidural Patient location during procedure: OB Start time: 09/23/2019 10:21 AM End time: 09/23/2019 10:34 AM  Staffing Anesthesiologist: Naomie Dean, MD Resident/CRNA: Rosanne Gutting, CRNA Performed: resident/CRNA   Preanesthetic Checklist Completed: patient identified, IV checked, site marked, risks and benefits discussed, surgical consent, monitors and equipment checked, pre-op evaluation and timeout performed  Epidural Patient position: sitting Prep: ChloraPrep Patient monitoring: heart rate, continuous pulse ox and blood pressure Approach: midline Location: L3-L4 Injection technique: LOR saline  Needle:  Needle type: Tuohy  Needle gauge: 17 G Needle length: 9 cm and 9 Needle insertion depth: 6 cm Catheter type: closed end flexible Catheter size: 19 Gauge Catheter at skin depth: 12 cm Test dose: negative and 1.5% lidocaine with Epi 1:200 K  Assessment Sensory level: T10 Events: blood not aspirated, injection not painful, no injection resistance, no paresthesia and negative IV test  Additional Notes 1 attempt Pt. Evaluated and documentation done after procedure finished. Patient identified. Risks/Benefits/Options discussed with patient including but not limited to bleeding, infection, nerve damage, paralysis, failed block, incomplete pain control, headache, blood pressure changes, nausea, vomiting, reactions to medication both or allergic, itching and postpartum back pain. Confirmed with bedside nurse the patient's most recent platelet count. Confirmed with patient that they are not currently taking any anticoagulation, have any bleeding history or any family history of bleeding disorders. Patient expressed understanding and wished to proceed. All questions were answered. Sterile technique was used throughout the entire procedure. Please see nursing notes for vital signs. Test dose was given through epidural catheter and negative prior to continuing to dose epidural or  start infusion. Warning signs of high block given to the patient including shortness of breath, tingling/numbness in hands, complete motor block, or any concerning symptoms with instructions to call for help. Patient was given instructions on fall risk and not to get out of bed. All questions and concerns addressed with instructions to call with any issues or inadequate analgesia.   Patient tolerated the insertion well without immediate complications.Reason for block:procedure for pain

## 2019-09-23 NOTE — Op Note (Signed)
Cesarean Section Procedure Note 09/23/19  Pre-operative Diagnosis:  1. Arrest of Decent  2. Preeclampsia  3. [redacted] week gestation  4. Meconium Post-operative Diagnosis: same, delivered. Procedure: Primary Low Transverse Cesarean Section Surgeon: Adelene Idler MD   Anesthesia: Epidural Estimated Blood Loss: 965 cc Complications: None; patient tolerated the procedure well.  Disposition: PACU - hemodynamically stable. Condition: stable   Findings: A female infant in the cephalic presentation. Amniotic fluid - meconium Birth weight: 7 lbs 13oz Apgars of 8 and 9.  Intact placenta with a three-vessel cord. Marginal cord insertion. Endometrium stained with meconium. Grossly normal uterus, tubes and ovaries bilaterally. No intraabdominal adhesions were noted.   Procedure Details    The patient was taken to operating room, identified as the correct patient and the procedure verified as C-Section Delivery. A time out was held and the above information confirmed. After induction of anesthesia, the patient was draped and prepped in the usual sterile manner. A Pfannenstiel incision was made and carried down through the subcutaneous tissue to the fascia. Fascial incision was made and extended transversely with the Mayo scissors. The fascia was separated from the underlying rectus tissue superiorly and inferiorly. The peritoneum was identified and entered bluntly. Peritoneal incision was extended longitudinally. A low transverse hysterotomy was made. The fetus was delivered atraumatically. The umbilical cord was clamped x2 and cut and the infant was handed to the awaiting pediatricians. The placenta was removed intact and appeared normal with a 3-vessel cord. The uterus was exteriorized and cleared of all clot and debris. The hysterotomy was closed with running sutures of 0 Vicryl suture. A second imbricating layer was placed with the same suture. Two figure of eight 0-chromic was used to obtain  hemostasis. Excellent hemostasis was observed.   The uterus was returned to the abdomen. The pelvis was irrigated and again, excellent hemostasis was noted. The peritoneum was closed with a running stitch of 2-0 Vicryl. The On Q Pain pump System was then placed.  Trocars were placed through the abdominal wall into the subfascial space and these were used to thread the silver soaker cathaters into place.The rectus muscles were inspected and were hemostatic. The rectus fascia was then reapproximated with running sutures of 0-vicryl, with careful placement not to incorporate the cathaters. Subcutaneous tissues are then irrigated with saline and hemostasis assured with the bovie. The subcutaneous fat was approximated with 3-0 plain and a running stitch.  The skin was closed with 4-0 monocryl suture in a subcuticular fashion followed by skin adhesive. The cathaters are flushed each with 5 mL of Bupivicaine and stabilized into place with dressing. Instrument, sponge, and needle counts were correct prior to the abdominal closure and at the conclusion of the case.  The patient tolerated the procedure well and was transferred to the recovery room in stable condition.   Natale Milch MD [facility], [facility] 09/23/19 11:01 PM

## 2019-09-23 NOTE — Progress Notes (Signed)
Patient has been pushing for 2 hours and 20 minutes.  She is feeling exhausted.  Vaginal exam shows that she has not progressed from 0 station despite position changes, laboring down, and pushing for over 2 hours.  Discussed options of continuing to push for a total of 3 hours versus a cesarean delivery. Patient would like to proceed with cesarean delivery. Anesthesia and OR notified.  Patient consented for the procedure.   Adelene Idler MD, [facility], [facility] 09/23/2019 8:54 PM

## 2019-09-23 NOTE — Progress Notes (Signed)
Jessica Waters is a 24 y.o. G1P0 at [redacted]w[redacted]d by ultrasound admitted for induction of labor due to Pre-eclamptic toxemia of pregnancy..  Subjective: Jessica Waters is beginning to feel her contractions. She slept well after receiving Stadol during the night. She c/o a slight headache.   Objective: BP (!) 146/100   Pulse 98   Temp 98.5 F (36.9 C) (Oral)   Resp 20   Ht 4\' 11"  (1.499 m)   Wt 72.6 kg   LMP 01/03/2019 (Exact Date)   BMI 32.32 kg/m  No intake/output data recorded. Total I/O In: -  Out: 200 [Urine:200] Fundal height: 41 cms.   EFW- 8+lbs FHT:  FHR: 145 bpm, variability: moderate,  accelerations:  Present,  decelerations:  Absent UC:   regular, every 2-4 minutes, mild to palpation SVE:   Dilation: 3 Effacement (%): 90 Station: -2 Exam by:: [email or web address] CNM  Labs: Lab Results  Component Value Date   WBC 13.8 (H) 09/22/2019   HGB 10.7 (L) 09/22/2019   HCT 30.8 (L) 09/22/2019   MCV 85.8 09/22/2019   PLT 256 09/22/2019    Assessment / Plan: Induction of labor due to preeclampsia,  progressing well on pitocin  Labor: Progressing on Pitocin, will continue to increase then AROM Preeclampsia:  no signs or symptoms of toxicity Fetal Wellbeing:  Category I Pain Control:  IV pain meds and she plans an epidural once more uncomfortable I/D:  n/a Anticipated MOD:  NSVD  Discussed the nature of her pre eclampsia with patient and her family, including the possible use of magnesium sulphate. Advance pitocin per protocol. Report to 11/23/2019 CNM planned this hour.  Hoover Brunette 09/23/2019, 8:55 AM

## 2019-09-23 NOTE — H&P (Signed)
OB History & Physical   History of Present Illness:  Chief Complaint: Pt presented to Labor and Delivery with complaint of suspected SROM. After a period of monitoring, and no active leaking of fluid, she was checked by an RN, and then had a ROM Plus test  retrieved that provided a postive  results. No leaking of fluid noted. Fern test was negative. The patient has also been contracting, but denies vaginal bleeding.  Speculum exam reveals a large mucous plug, and no visible fluid.  The patient's blood pressures have been elevated on her arrival, and after several hours under observation . She complains of a headache.   HPI:  Jessica Waters is a 24 y.o. G1P0 female at [redacted]w[redacted]d dated by LMP and ultrasound.  Her pregnancy has been uncomplicated.    She reports contractions.   She reports leakage of fluid.   She denies vaginal bleeding.   She reports fetal movement.    Total weight gain for pregnancy: 15.9 kg   Obstetrical Problem List: pregnancy Problems (from 02/19/19 to present)    Problem Noted Resolved   Supervision of normal first pregnancy, antepartum 02/19/2019 by Tresea Mall, CNM No   Overview Addendum 08/27/2019 11:43 AM by Mirna Mires, CNM    Clinic [facility] Prenatal Labs  Dating LMP, [place] Blood type: O/Positive/-- (01/08 1541)   Genetic Screen 1 Screen: Neg Antibody:Negative (01/08 1541)  Anatomic [place] [facility], MFM nml Rubella: 2.78 (01/08 1541) Varicella: Imm  GTT 28 wk: 91 RPR: Non Reactive (01/08 1541)   Rhogam n/a HBsAg: Negative (01/08 1541)   Vaccines TDAP: 07/30/2019                   Flu Shot: Covid: Jan 2021 HIV: Non Reactive (01/08 1541)   Baby Food Breast                               GBS:   Contraception  Pap: 1st PAP 02/19/19  CBB  No   CS/VBAC NA   Support Person Husband [name]          Previous Version       Maternal Medical History:   Past Medical History:  Diagnosis Date   Allergy to alpha-gal    Anxiety    Headache    Migraines    Past  Surgical History:  Procedure Laterality Date   TONSILLECTOMY      Allergies  Allergen Reactions   Pork-Derived Products Swelling    Face swelling and severe cramps   Beef-Derived Products     Prior to Admission medications   Medication Sig Start Date End Date Taking? Authorizing Provider  OVER THE COUNTER MEDICATION Take 1 capsule by mouth daily. Nature made prenatal vitamin   Yes [provider]  venlafaxine XR (EFFEXOR-XR) 75 MG 24 hr capsule Take 75 mg by mouth daily. 02/12/19  Yes [provider]  EPINEPHrine 0.3 mg/0.3 mL IJ SOAJ injection epinephrine 0.3 mg/0.3 mL injection, auto-injector Patient not taking: Reported on 09/22/2019    [provider]    OB History  Gravida Para Term Preterm AB Living  1            SAB TAB Ectopic Multiple Live Births               # Outcome Date GA Lbr Len/2nd Weight Sex Delivery Anes PTL Lv  1 Current  Prenatal care site: [facility]/  Social History: She  reports that she has never smoked. She has never used smokeless tobacco. She reports previous alcohol use. She reports that she does not use drugs.  Family History: family history includes Fibromyalgia in her paternal aunt; Lupus in her mother; Rheum arthritis in her maternal aunt.  She denies a family of gynecologic cancers  Review of Systems:  Review of Systems  Neurological: Positive for headaches.  All other systems reviewed and are negative.    Physical Exam:  BP (!) 145/99    Pulse (!) 114    Temp 97.8 F (36.6 C) (Oral)    Resp 17    Ht 4\' 11"  (1.499 m)    Wt 72.6 kg    LMP 01/03/2019 (Exact Date)    BMI 32.32 kg/m   OBGyn Exam   Fetal Tracing: Baseline FHR: 150 beats/min   Variability: moderate   Accelerations: present   Decelerations: absent Contractions: present frequency: irregular, q 4-6 minutes, mild Overall assessment: Category 1  Labs: Hgb 10, Hct 30 UPC ratio is .59 Platelets 256,000  No results found for:  SARSCOV2NAA] rapid test pending  Assessment:  Jessica Waters is a 24 y.o. G1P0 female at [redacted]w[redacted]d with new onset of pre- eclampsia Reactive, reassuring FHTs.   Plan:  1. Admit to Labor & Delivery  2. CBC, T&S, Clrs, IVF 3. GBS negative.   4. Fetal well-being:  EFM 5. Pitocin per low dose protocol. 6. Plans epidural for pain control. 7. Dr. [redacted]w[redacted]d notified of admission and maternal/fetal status.   Jean Rosenthal, MD 09/23/2019 1:12 AM

## 2019-09-24 ENCOUNTER — Encounter: Payer: Self-pay | Admitting: Obstetrics

## 2019-09-24 ENCOUNTER — Encounter: Payer: BC Managed Care – PPO | Admitting: Advanced Practice Midwife

## 2019-09-24 ENCOUNTER — Ambulatory Visit: Payer: BC Managed Care – PPO

## 2019-09-24 DIAGNOSIS — O1494 Unspecified pre-eclampsia, complicating childbirth: Secondary | ICD-10-CM

## 2019-09-24 DIAGNOSIS — Z3A37 37 weeks gestation of pregnancy: Secondary | ICD-10-CM

## 2019-09-24 LAB — CBC
HCT: 22.4 % — ABNORMAL LOW (ref 36.0–46.0)
Hemoglobin: 7.7 g/dL — ABNORMAL LOW (ref 12.0–15.0)
MCH: 29.5 pg (ref 26.0–34.0)
MCHC: 34.4 g/dL (ref 30.0–36.0)
MCV: 85.8 fL (ref 80.0–100.0)
Platelets: 223 10*3/uL (ref 150–400)
RBC: 2.61 MIL/uL — ABNORMAL LOW (ref 3.87–5.11)
RDW: 15.6 % — ABNORMAL HIGH (ref 11.5–15.5)
WBC: 17.9 10*3/uL — ABNORMAL HIGH (ref 4.0–10.5)
nRBC: 0 % (ref 0.0–0.2)

## 2019-09-24 LAB — HEMOGLOBIN AND HEMATOCRIT, BLOOD
HCT: 21.9 % — ABNORMAL LOW (ref 36.0–46.0)
Hemoglobin: 7.4 g/dL — ABNORMAL LOW (ref 12.0–15.0)

## 2019-09-24 MED ORDER — DIBUCAINE (PERIANAL) 1 % EX OINT: 1.0000 "application " | TOPICAL_OINTMENT | CUTANEOUS | Status: DC | PRN

## 2019-09-24 MED ORDER — SIMETHICONE 80 MG PO CHEW
80.0000 mg | CHEWABLE_TABLET | Freq: Three times a day (TID) | ORAL | Status: DC
  Administered 2019-09-24 – 2019-09-26 (×6): 80 mg via ORAL
  Filled 2019-09-24 (×6): qty 1

## 2019-09-24 MED ORDER — BISACODYL 10 MG RE SUPP: 10.0000 mg | Freq: Every day | RECTAL | Status: DC | PRN

## 2019-09-24 MED ORDER — ACETAMINOPHEN 500 MG PO TABS
1000.0000 mg | ORAL_TABLET | Freq: Four times a day (QID) | ORAL | Status: DC
  Administered 2019-09-24 – 2019-09-26 (×10): 1000 mg via ORAL
  Filled 2019-09-24 (×9): qty 2

## 2019-09-24 MED ORDER — LACTATED RINGERS IV SOLN: INTRAVENOUS | Status: DC

## 2019-09-24 MED ORDER — DIPHENHYDRAMINE HCL 25 MG PO CAPS: 25.0000 mg | ORAL_CAPSULE | Freq: Four times a day (QID) | ORAL | Status: DC | PRN

## 2019-09-24 MED ORDER — LABETALOL HCL 100 MG PO TABS
100.0000 mg | ORAL_TABLET | Freq: Two times a day (BID) | ORAL | Status: DC
  Administered 2019-09-24 – 2019-09-26 (×4): 100 mg via ORAL
  Filled 2019-09-24 (×4): qty 1

## 2019-09-24 MED ORDER — PRENATAL MULTIVITAMIN CH
1.0000 | ORAL_TABLET | Freq: Every day | ORAL | Status: DC
  Administered 2019-09-24 – 2019-09-26 (×3): 1 via ORAL
  Filled 2019-09-24 (×3): qty 1

## 2019-09-24 MED ORDER — ACETAMINOPHEN 500 MG PO TABS
ORAL_TABLET | ORAL | Status: AC
  Filled 2019-09-24: qty 2

## 2019-09-24 MED ORDER — VENLAFAXINE HCL ER 75 MG PO CP24
75.0000 mg | ORAL_CAPSULE | Freq: Every day | ORAL | Status: DC
  Administered 2019-09-25: 75 mg via ORAL
  Filled 2019-09-24 (×3): qty 1

## 2019-09-24 MED ORDER — SIMETHICONE 80 MG PO CHEW
80.0000 mg | CHEWABLE_TABLET | ORAL | Status: DC | PRN
  Administered 2019-09-24: 80 mg via ORAL

## 2019-09-24 MED ORDER — MENTHOL 3 MG MT LOZG
1.0000 | LOZENGE | OROMUCOSAL | Status: DC | PRN
  Filled 2019-09-24: qty 9

## 2019-09-24 MED ORDER — IBUPROFEN 800 MG PO TABS
800.0000 mg | ORAL_TABLET | Freq: Three times a day (TID) | ORAL | Status: DC
  Administered 2019-09-24 – 2019-09-26 (×8): 800 mg via ORAL
  Filled 2019-09-24 (×8): qty 1

## 2019-09-24 MED ORDER — WITCH HAZEL-GLYCERIN EX PADS: 1.0000 "application " | MEDICATED_PAD | CUTANEOUS | Status: DC | PRN

## 2019-09-24 MED ORDER — FERROUS SULFATE 325 (65 FE) MG PO TABS
325.0000 mg | ORAL_TABLET | Freq: Two times a day (BID) | ORAL | Status: DC
  Administered 2019-09-24 – 2019-09-26 (×5): 325 mg via ORAL
  Filled 2019-09-24 (×5): qty 1

## 2019-09-24 MED ORDER — FLEET ENEMA 7-19 GM/118ML RE ENEM: 1.0000 | ENEMA | Freq: Every day | RECTAL | Status: DC | PRN

## 2019-09-24 MED ORDER — COCONUT OIL OIL
1.0000 "application " | TOPICAL_OIL | Status: DC | PRN
  Filled 2019-09-24: qty 120

## 2019-09-24 MED ORDER — ZOLPIDEM TARTRATE 5 MG PO TABS: 5.0000 mg | ORAL_TABLET | Freq: Every evening | ORAL | Status: DC | PRN

## 2019-09-24 MED ORDER — HYDROMORPHONE HCL 1 MG/ML IJ SOLN: 0.2000 mg | INTRAMUSCULAR | Status: DC | PRN

## 2019-09-24 MED ORDER — SIMETHICONE 80 MG PO CHEW
80.0000 mg | CHEWABLE_TABLET | ORAL | Status: DC
  Administered 2019-09-25 – 2019-09-26 (×2): 80 mg via ORAL
  Filled 2019-09-24 (×3): qty 1

## 2019-09-24 MED ORDER — SENNOSIDES-DOCUSATE SODIUM 8.6-50 MG PO TABS
2.0000 | ORAL_TABLET | ORAL | Status: DC
  Administered 2019-09-24 – 2019-09-26 (×3): 2 via ORAL
  Filled 2019-09-24 (×3): qty 2

## 2019-09-24 NOTE — Progress Notes (Signed)
POD #1 Subjective:  Denies lightheadedness when OOB. Feels well. Tolerating regular diet.    Objective:  Blood pressure (!) 141/81, pulse (!) 112, temperature 98.5 F (36.9 C), temperature source Oral, resp. rate 18, height 4\' 11"  (1.499 m), weight 72.6 kg, last menstrual period 01/03/2019, SpO2 97 %, unknown if currently breastfeeding.  Excellent urine output: 01/05/2019  out since midnight. Foley draining clear yellow urine.   General: NAD, sitting up holding baby.  Pulmonary: no increased work of breathing Lochia: small amount  Results for orders placed or performed during the hospital encounter of 09/22/19 (from the past 72 hour(s))  Urinalysis, Complete w Microscopic     Status: Abnormal   Collection Time: 09/22/19 10:36 PM  Result Value Ref Range   Color, Urine YELLOW (A) YELLOW   APPearance HAZY (A) CLEAR   Specific Gravity, Urine 1.021 1.005 - 1.030   pH 6.0 5.0 - 8.0   Glucose, UA NEGATIVE NEGATIVE mg/dL   Hgb urine dipstick NEGATIVE NEGATIVE   Bilirubin Urine NEGATIVE NEGATIVE   Ketones, ur NEGATIVE NEGATIVE mg/dL   Protein, ur 30 (A) NEGATIVE mg/dL   Nitrite NEGATIVE NEGATIVE   Leukocytes,Ua NEGATIVE NEGATIVE   RBC / HPF 0-5 0 - 5 RBC/hpf   WBC, UA 0-5 0 - 5 WBC/hpf   Bacteria, UA RARE (A) NONE SEEN   Squamous Epithelial / LPF 0-5 0 - 5   Mucus PRESENT     Comment: Performed at [facility], [address] [place], [place]  ROM Plus [facility] only)     Status: None   Collection Time: 09/22/19 10:36 PM  Result Value Ref Range   Rom Plus POSITIVE     Comment: Performed at [facility], [address] [place], [place]  Protein / creatinine ratio, urine     Status: Abnormal   Collection Time: 09/22/19 10:36 PM  Result Value Ref Range   Creatinine, Urine 135 mg/dL   Total Protein, Urine 80 mg/dL    Comment: NO NORMAL RANGE ESTABLISHED FOR THIS TEST   Protein Creatinine Ratio 0.59 (H) 0.00 - 0.15 mg/mg[Cre]    Comment: Performed at  [facility], [address] [place], [place]  CBC with Differential/Platelet     Status: Abnormal   Collection Time: 09/22/19 11:46 PM  Result Value Ref Range   WBC 13.8 (H) 4.0 - 10.5 K/uL   RBC 3.59 (L) 3.87 - 5.11 MIL/uL   Hemoglobin 10.7 (L) 12.0 - 15.0 g/dL   HCT 11/23/19 (L) 36 - 46 %   MCV 85.8 80.0 - 100.0 fL   MCH 29.8 26.0 - 34.0 pg   MCHC 34.7 30.0 - 36.0 g/dL   RDW 46.9 (H) 62.9 - 52.8 %   Platelets 256 150 - 400 K/uL   nRBC 0.2 0.0 - 0.2 %   Neutrophils Relative % 70 %   Neutro Abs 9.9 (H) 1.7 - 7.7 K/uL   Lymphocytes Relative 18 %   Lymphs Abs 2.5 0.7 - 4.0 K/uL   Monocytes Relative 7 %   Monocytes Absolute 1.0 0 - 1 K/uL   Eosinophils Relative 2 %   Eosinophils Absolute 0.2 0 - 0 K/uL   Basophils Relative 1 %   Basophils Absolute 0.1 0 - 0 K/uL   Immature Granulocytes 2 %   Abs Immature Granulocytes 0.22 (H) 0.00 - 0.07 K/uL    Comment: Performed at [facility], [address] [place], [place]  Comprehensive  metabolic panel     Status: Abnormal   Collection Time: 09/22/19 11:46 PM  Result Value Ref Range   Sodium 134 (L) 135 - 145 mmol/L   Potassium 4.3 3.5 - 5.1 mmol/L   Chloride 103 98 - 111 mmol/L   CO2 23 22 - 32 mmol/L   Glucose, Bld 89 70 - 99 mg/dL    Comment: Glucose reference range applies only to samples taken after fasting for at least 8 hours.   BUN 11 6 - 20 mg/dL   Creatinine, Ser 0.09 0.44 - 1.00 mg/dL   Calcium 9.0 8.9 - 23.3 mg/dL   Total Protein 6.2 (L) 6.5 - 8.1 g/dL   Albumin 2.7 (L) 3.5 - 5.0 g/dL   AST 15 15 - 41 U/L   ALT 9 0 - 44 U/L   Alkaline Phosphatase 138 (H) 38 - 126 U/L   Total Bilirubin 0.4 0.3 - 1.2 mg/dL   GFR calc non Af Amer >60 >60 mL/min   GFR calc Af Amer >60 >60 mL/min   Anion gap 8 5 - 15    Comment: Performed at [facility], [address] [place], [place] [address]  ABO/Rh     Status: None   Collection Time: 09/22/19 11:46 PM  Result Value Ref Range    ABO/RH(D)      O POS Performed at [facility], [address] [place], [place] [address]   SARS Coronavirus 2 by RT PCR (hospital order, performed in [facility] hospital lab) Nasopharyngeal Nasopharyngeal Swab     Status: None   Collection Time: 09/23/19  1:13 AM   Specimen: Nasopharyngeal Swab  Result Value Ref Range   SARS Coronavirus 2 NEGATIVE NEGATIVE    Comment: (NOTE) SARS-CoV-2 target nucleic acids are NOT DETECTED.  The SARS-CoV-2 RNA is generally detectable in upper and lower respiratory specimens during the acute phase of infection. The lowest concentration of SARS-CoV-2 viral copies this assay can detect is 250 copies / mL. A negative result does not preclude SARS-CoV-2 infection and should not be used as the sole basis for treatment or other patient management decisions.  A negative result may occur with improper specimen collection / handling, submission of specimen other than nasopharyngeal swab, presence of viral mutation(s) within the areas targeted by this assay, and inadequate number of viral copies (<250 copies / mL). A negative result must be combined with clinical observations, patient history, and epidemiological information.  Fact Sheet for Patients:   BoilerBrush.com.cy  Fact Sheet for Healthcare Providers: https://pope.com/  This test is not yet approved or  cleared by the [place] FDA and has been authorized for detection and/or diagnosis of SARS-CoV-2 by FDA under an Emergency Use Authorization (EUA).  This EUA will remain in effect (meaning this test can be used) for the duration of the COVID-19 declaration under Section 564(b)(1) of the Act, 21 U.S.C. section 360bbb-3(b)(1), unless the authorization is terminated or revoked sooner.  Performed at [facility], [address] [place], [place] [address]   Type and screen [facility]     Status: None    Collection Time: 09/23/19  1:38 AM  Result Value Ref Range   ABO/RH(D) O POS    Antibody Screen NEG    Sample Expiration      09/26/2019,2359 Performed at [facility], [address] [place], [place] [address]   RPR     Status: None   Collection Time: 09/23/19  1:38 AM  Result Value Ref Range   RPR Ser Ql NON REACTIVE NON REACTIVE    Comment: Performed at [facility], [address] [place], [place] [address]  CBC     Status: Abnormal   Collection Time: 09/24/19  6:27 AM  Result Value Ref Range   WBC 17.9 (H) 4.0 - 10.5 K/uL   RBC 2.61 (L) 3.87 - 5.11 MIL/uL   Hemoglobin 7.7 (L) 12.0 - 15.0 g/dL    Comment: REPEATED TO VERIFY   HCT 22.4 (L) 36 - 46 %   MCV 85.8 80.0 - 100.0 fL   MCH 29.5 26.0 - 34.0 pg   MCHC 34.4 30.0 - 36.0 g/dL   RDW 97.6 (H) 73.4 - 19.3 %   Platelets 223 150 - 400 K/uL   nRBC 0.0 0.0 - 0.2 %    Comment: Performed at [facility], [address] [place], [place] [address]  Hemoglobin and hematocrit, blood     Status: Abnormal   Collection Time: 09/24/19 12:12 PM  Result Value Ref Range   Hemoglobin 7.4 (L) 12.0 - 15.0 g/dL   HCT 09.7 (L) 36 - 46 %    Comment: Performed at [facility], [address], [place], [place] [address]     Assessment/ Plan:   24 y.o. G1P1001 postoperativeday # 1   Chronic anemia worsened with acute blood loss from surgery  Hemoglobin stable: 7.7 ->7.4 gm/dl  Excellent urine output  Tachycardic but denies lightheadedness when OOB  Repeat CBC in AM  DC foley  Saline lock IV   Preeclampsia without severe features.  Mild blood pressure elevations in the 140s/ 81-94  Will discuss starting antihypertensive with Dr Tiburcio Pea  Farrel Conners, CNM

## 2019-09-24 NOTE — Progress Notes (Signed)
Patient ID: Jessica Waters, female   DOB: 01/08/1996, 24 y.o.   MRN: [id number]  Subjective:   She is feeling much better after sleeping. She reports pain is controlled. Denies flatus. Foley in place.   Objective:  Blood pressure 126/79, pulse (!) 109, temperature 98.7 F (37.1 C), temperature source Oral, resp. rate 20, height 4\' 11"  (1.499 m), weight 72.6 kg, last menstrual period 01/03/2019, SpO2 99 %, unknown if currently breastfeeding.  General: NAD Pulmonary: no increased work of breathing Abdomen: non-distended, non-tender, fundus firm at level of umbilicus Incision: clean, dry, intact Extremities: no edema, no erythema, no tenderness  Results for orders placed or performed during the hospital encounter of 09/22/19 (from the past 72 hour(s))  Urinalysis, Complete w Microscopic     Status: Abnormal   Collection Time: 09/22/19 10:36 PM  Result Value Ref Range   Color, Urine YELLOW (A) YELLOW   APPearance HAZY (A) CLEAR   Specific Gravity, Urine 1.021 1.005 - 1.030   pH 6.0 5.0 - 8.0   Glucose, UA NEGATIVE NEGATIVE mg/dL   Hgb urine dipstick NEGATIVE NEGATIVE   Bilirubin Urine NEGATIVE NEGATIVE   Ketones, ur NEGATIVE NEGATIVE mg/dL   Protein, ur 30 (A) NEGATIVE mg/dL   Nitrite NEGATIVE NEGATIVE   Leukocytes,Ua NEGATIVE NEGATIVE   RBC / HPF 0-5 0 - 5 RBC/hpf   WBC, UA 0-5 0 - 5 WBC/hpf   Bacteria, UA RARE (A) NONE SEEN   Squamous Epithelial / LPF 0-5 0 - 5   Mucus PRESENT     Comment: Performed at [facility], [address] [place], [place]  ROM Plus [facility] only)     Status: None   Collection Time: 09/22/19 10:36 PM  Result Value Ref Range   Rom Plus POSITIVE     Comment: Performed at [facility], [address] [place], [place]  Protein / creatinine ratio, urine     Status: Abnormal   Collection Time: 09/22/19 10:36 PM  Result Value Ref Range   Creatinine, Urine 135 mg/dL   Total Protein, Urine 80 mg/dL    Comment: NO  NORMAL RANGE ESTABLISHED FOR THIS TEST   Protein Creatinine Ratio 0.59 (H) 0.00 - 0.15 mg/mg[Cre]    Comment: Performed at [facility], [address] [place], [place]  CBC with Differential/Platelet     Status: Abnormal   Collection Time: 09/22/19 11:46 PM  Result Value Ref Range   WBC 13.8 (H) 4.0 - 10.5 K/uL   RBC 3.59 (L) 3.87 - 5.11 MIL/uL   Hemoglobin 10.7 (L) 12.0 - 15.0 g/dL   HCT 11/23/19 (L) 36 - 46 %   MCV 85.8 80.0 - 100.0 fL   MCH 29.8 26.0 - 34.0 pg   MCHC 34.7 30.0 - 36.0 g/dL   RDW 58.0 (H) 99.8 - 33.8 %   Platelets 256 150 - 400 K/uL   nRBC 0.2 0.0 - 0.2 %   Neutrophils Relative % 70 %   Neutro Abs 9.9 (H) 1.7 - 7.7 K/uL   Lymphocytes Relative 18 %   Lymphs Abs 2.5 0.7 - 4.0 K/uL   Monocytes Relative 7 %   Monocytes Absolute 1.0 0 - 1 K/uL   Eosinophils Relative 2 %   Eosinophils Absolute 0.2 0 - 0 K/uL   Basophils Relative 1 %   Basophils Absolute 0.1 0 - 0 K/uL   Immature Granulocytes 2 %   Abs Immature Granulocytes 0.22 (H) 0.00 - 0.07  K/uL    Comment: Performed at [facility], [address] [place], [place] [address]  Comprehensive metabolic panel     Status: Abnormal   Collection Time: 09/22/19 11:46 PM  Result Value Ref Range   Sodium 134 (L) 135 - 145 mmol/L   Potassium 4.3 3.5 - 5.1 mmol/L   Chloride 103 98 - 111 mmol/L   CO2 23 22 - 32 mmol/L   Glucose, Bld 89 70 - 99 mg/dL    Comment: Glucose reference range applies only to samples taken after fasting for at least 8 hours.   BUN 11 6 - 20 mg/dL   Creatinine, Ser 7.32 0.44 - 1.00 mg/dL   Calcium 9.0 8.9 - 20.2 mg/dL   Total Protein 6.2 (L) 6.5 - 8.1 g/dL   Albumin 2.7 (L) 3.5 - 5.0 g/dL   AST 15 15 - 41 U/L   ALT 9 0 - 44 U/L   Alkaline Phosphatase 138 (H) 38 - 126 U/L   Total Bilirubin 0.4 0.3 - 1.2 mg/dL   GFR calc non Af Amer >60 >60 mL/min   GFR calc Af Amer >60 >60 mL/min   Anion gap 8 5 - 15    Comment: Performed at [facility], [address] [place], [place] [address]  ABO/Rh     Status: None   Collection Time: 09/22/19 11:46 PM  Result Value Ref Range   ABO/RH(D)      O POS Performed at [facility], [address] [place], [place] [address]   SARS Coronavirus 2 by RT PCR (hospital order, performed in [facility] hospital lab) Nasopharyngeal Nasopharyngeal Swab     Status: None   Collection Time: 09/23/19  1:13 AM   Specimen: Nasopharyngeal Swab  Result Value Ref Range   SARS Coronavirus 2 NEGATIVE NEGATIVE    Comment: (NOTE) SARS-CoV-2 target nucleic acids are NOT DETECTED.  The SARS-CoV-2 RNA is generally detectable in upper and lower respiratory specimens during the acute phase of infection. The lowest concentration of SARS-CoV-2 viral copies this assay can detect is 250 copies / mL. A negative result does not preclude SARS-CoV-2 infection and should not be used as the sole basis for treatment or other patient management decisions.  A negative result may occur with improper specimen collection / handling, submission of specimen other than nasopharyngeal swab, presence of viral mutation(s) within the areas targeted by this assay, and inadequate number of viral copies (<250 copies / mL). A negative result must be combined with clinical observations, patient history, and epidemiological information.  Fact Sheet for Patients:   BoilerBrush.com.cy  Fact Sheet for Healthcare Providers: https://pope.com/  This test is not yet approved or  cleared by the [place] FDA and has been authorized for detection and/or diagnosis of SARS-CoV-2 by FDA under an Emergency Use Authorization (EUA).  This EUA will remain in effect (meaning this test can be used) for the duration of the COVID-19 declaration under Section 564(b)(1) of the Act, 21 U.S.C. section 360bbb-3(b)(1), unless the authorization is terminated or revoked sooner.  Performed at [facility], [address] [place], [place] [address]   Type and screen [facility]     Status: None   Collection Time: 09/23/19  1:38 AM  Result Value Ref Range   ABO/RH(D) O POS    Antibody Screen NEG    Sample Expiration      09/26/2019,2359 Performed at [facility], [address] [place],  [place] [address]   RPR     Status: None   Collection Time: 09/23/19  1:38 AM  Result Value Ref Range   RPR Ser Ql NON REACTIVE NON REACTIVE    Comment: Performed at [facility], [address] [place], [place] [address]  CBC     Status: Abnormal   Collection Time: 09/24/19  6:27 AM  Result Value Ref Range   WBC 17.9 (H) 4.0 - 10.5 K/uL   RBC 2.61 (L) 3.87 - 5.11 MIL/uL   Hemoglobin 7.7 (L) 12.0 - 15.0 g/dL    Comment: REPEATED TO VERIFY   HCT 22.4 (L) 36 - 46 %   MCV 85.8 80.0 - 100.0 fL   MCH 29.5 26.0 - 34.0 pg   MCHC 34.4 30.0 - 36.0 g/dL   RDW 19.1 (H) 47.8 - 29.5 %   Platelets 223 150 - 400 K/uL   nRBC 0.0 0.0 - 0.2 %    Comment: Performed at [facility], [address] [place], [place] [address]     Assessment:   24 y.o. G1P1001 postoperativeday # 1   Plan:  1) Acute blood loss anemia - hemodynamically stable. Tachycardic. Considering blood transfusion. PO ferrous sulfate  2) Blood Type --/--/O POS (07/06 0138)   3) Rubella 2.78 (01/08 1541) / Varicella Immune  4) TDAP status - up to date Immunization History  Administered Date(s) Administered  . Tdap 07/30/2019    5) Feeding- Breast   6) Contraception - not discussed.  7) Disposition - continue care  Adelene Idler MD, [facility], [facility] 09/24/2019 8:48 AM

## 2019-09-24 NOTE — Lactation Note (Signed)
This note was copied from a baby's chart. Lactation Consultation Note  Patient Name: Jessica Waters [id number] Date: 09/24/2019 Reason for consult: Initial assessment;Early term 37-38.6wks;1st time breastfeeding;Other (Comment) (c-section)  LC in to see mom and baby [name]. This is mom's first baby, delivered via c-section after a long labor. Mom has breastfed a few times since delivery, noting some nipple discomfort. Baby actively feeding prior to [facility] entering room in laid back position, lying across mom's stomach. Baby displayed flanged top/bottom lips, strong rhythmic sucking pattern, and intermittent audible swallows, mom feeling pinching sensation.  Baby did pop off the breast and nipple notes to be pinched, instructed mom to do sandwich hold (like a hamburger) to help obtain deeper latch, control baby's head with hand behind leading it towards the breast. Baby with wide open mouth was brought back to the breast by mom with LC support, and baby latched easily immediately beginning strong rhythmic sucking, but this time mom noting more tugging that pinching feeling.  LC reviewed breastfeeding basics: newborn stomach size, feeding behaviors, early hunger cues, benefits of skin to skin, and output expectations in first 24 hours. Coconut oil was given with instructions, and mom was also encouraged to express colostrum and rub into nipple and areola and allow to air dry.  Mom had no other questions/concerns, and LC name/number given to parents; encouraged to call out today as needed for ongoing breastfeeding support.  Maternal Data Formula Feeding for Exclusion: No Has patient been taught Hand Expression?: Yes Does the patient have breastfeeding experience prior to this delivery?: No  Feeding Feeding Type: Breast Fed  LATCH Score Latch: Grasps breast easily, tongue down, lips flanged, rhythmical sucking.  Audible Swallowing: A few with stimulation  Type of Nipple: Everted at rest and after  stimulation  Comfort (Breast/Nipple): Soft / non-tender  Hold (Positioning): Assistance needed to correctly position infant at breast and maintain latch. (minor adjustment)  LATCH Score: 8  Interventions Interventions: Breast feeding basics reviewed;Hand express;Adjust position;Support pillows;Coconut oil  Lactation Tools Discussed/Used Tools: Coconut oil   Consult Status Consult Status: Follow-up Date: 09/24/19 Follow-up type: In-patient    Danford Bad 09/24/2019, 12:08 PM

## 2019-09-24 NOTE — Anesthesia Postprocedure Evaluation (Signed)
Anesthesia Post Note  Patient: Jessica Waters  Procedure(s) Performed: CESAREAN SECTION (N/A Abdomen)  Patient location during evaluation: Mother Baby Anesthesia Type: Epidural Level of consciousness: oriented and awake and alert Pain management: pain level controlled Vital Signs Assessment: post-procedure vital signs reviewed and stable Respiratory status: spontaneous breathing and respiratory function stable Cardiovascular status: blood pressure returned to baseline and stable Postop Assessment: no headache, no backache, no apparent nausea or vomiting and able to ambulate Anesthetic complications: no   No complications documented.   Last Vitals:  Vitals:   09/24/19 0315 09/24/19 0847  BP: 126/79 (!) 145/94  Pulse: (!) 109 (!) 112  Resp: 20 18  Temp: 37.1 C 37 C  SpO2: 99% 97%    Last Pain:  Vitals:   09/24/19 0847  TempSrc: Oral  PainSc:                  Starling Manns

## 2019-09-25 LAB — COMPREHENSIVE METABOLIC PANEL
ALT: 10 U/L (ref 0–44)
AST: 18 U/L (ref 15–41)
Albumin: 2 g/dL — ABNORMAL LOW (ref 3.5–5.0)
Alkaline Phosphatase: 87 U/L (ref 38–126)
Anion gap: 6 (ref 5–15)
BUN: 10 mg/dL (ref 6–20)
CO2: 24 mmol/L (ref 22–32)
Calcium: 8 mg/dL — ABNORMAL LOW (ref 8.9–10.3)
Chloride: 108 mmol/L (ref 98–111)
Creatinine, Ser: 0.61 mg/dL (ref 0.44–1.00)
GFR calc Af Amer: >60 mL/min (ref >60)
GFR calc non Af Amer: >60 mL/min (ref >60)
Glucose, Bld: 79 mg/dL (ref 70–99)
Potassium: 4.3 mmol/L (ref 3.5–5.1)
Sodium: 138 mmol/L (ref 135–145)
Total Bilirubin: 0.3 mg/dL (ref 0.3–1.2)
Total Protein: 5.1 g/dL — ABNORMAL LOW (ref 6.5–8.1)

## 2019-09-25 LAB — SURGICAL PATHOLOGY

## 2019-09-25 LAB — HEMOGLOBIN AND HEMATOCRIT, BLOOD
HCT: 23.4 % — ABNORMAL LOW (ref 36.0–46.0)
Hemoglobin: 8.1 g/dL — ABNORMAL LOW (ref 12.0–15.0)

## 2019-09-25 LAB — CBC
HCT: 21 % — ABNORMAL LOW (ref 36.0–46.0)
Hemoglobin: 6.9 g/dL — ABNORMAL LOW (ref 12.0–15.0)
MCH: 29.5 pg (ref 26.0–34.0)
MCHC: 32.9 g/dL (ref 30.0–36.0)
MCV: 89.7 fL (ref 80.0–100.0)
Platelets: 255 10*3/uL (ref 150–400)
RBC: 2.34 MIL/uL — ABNORMAL LOW (ref 3.87–5.11)
RDW: 15.9 % — ABNORMAL HIGH (ref 11.5–15.5)
WBC: 15.8 10*3/uL — ABNORMAL HIGH (ref 4.0–10.5)
nRBC: 0.1 % (ref 0.0–0.2)

## 2019-09-25 LAB — PREPARE RBC (CROSSMATCH)

## 2019-09-25 MED ORDER — SODIUM CHLORIDE 0.9% IV SOLUTION: Freq: Once | INTRAVENOUS | Status: AC

## 2019-09-25 NOTE — Lactation Note (Signed)
This note was copied from a baby's chart. Lactation Consultation Note  Patient Name: Jessica Waters Date: 09/25/2019 Reason for consult: Follow-up assessment;1st time breastfeeding;Early term 37-38.6wks (c-section)  Lactation follow-up. Mom alert, baby sleeping soundly. Mom voices improvement with latch, initial nipple tenderness with latch that subsides within seconds; using expressed colostrum for healing/comfort post-feeds. Mom is comfortable with positioning, breaking seal, re-latching, and has dad in room for support as needed. LC reviewed breastfeeding basics specific to expectations for day 2 of life, feeding behaviors, newborn stomach sizes, potential for cluster feeding, and output expectations. Encouraged parents to call out today for any assistance needed or with any questions.  Maternal Data Formula Feeding for Exclusion: No Has patient been taught Hand Expression?: Yes Does the patient have breastfeeding experience prior to this delivery?: No  Feeding    LATCH Score                   Interventions Interventions: Breast feeding basics reviewed  Lactation Tools Discussed/Used     Consult Status Consult Status: PRN Date: 09/25/19 Follow-up type: Call as needed    Danford Bad 09/25/2019, 9:52 AM

## 2019-09-25 NOTE — Progress Notes (Signed)
Subjective: Postpartum Day 1.5: Cesarean Delivery Patient reports tolerating PO, + flatus and no problems voiding.  She denies any dizziness when getting up but agrees that she is pale today. Has just started on a one unit blood infusion due to low HGB.  Objective: Vital signs in last 24 hours: Temp:  [97.8 F (36.6 C)-98.9 F (37.2 C)] 97.9 F (36.6 C) (07/08 0843) Pulse Rate:  [100-120] 100 (07/08 0843) Resp:  [18-20] 20 (07/08 0843) BP: (119-142)/(72-91) 120/84 (07/08 0843) SpO2:  [95 %-100 %] 100 % (07/08 0843)  Physical Exam:  General: alert, cooperative, appears stated age, no distress and mildly obese Lochia: appropriate Uterine Fundus: firm Incision: healing well, no significant drainage DVT Evaluation: No evidence of DVT seen on physical exam. Negative Homan's sign. No cords or calf tenderness.  Recent Labs    09/24/19 1212 09/25/19 0601  HGB 7.4* 6.9*  HCT 21.9* 21.0*    Assessment/Plan: Status post Cesarean section. Postoperative course complicated by larger blood loss  Continue current care  Receiving blood transfusion this morning. Support lactation.Mirna Mires 09/25/2019, 9:27 AM

## 2019-09-25 NOTE — Progress Notes (Signed)
POD #2 s/p Cesarean section for arrest of descent Subjective:   Slept a little overnight. Denies lightheadedness  Objective:  Blood pressure 119/83, pulse 100, temperature 98.3 F (36.8 C), temperature source Oral, resp. rate 20, height 4\' 11"  (1.499 m), weight 72.6 kg, last menstrual period 01/03/2019, SpO2 95 %, unknown if currently breastfeeding. Patient Vitals for the past 24 hrs:  BP Temp Temp src Pulse Resp SpO2  09/25/19 0735 119/83 98.3 F (36.8 C) Oral 100 20 95 %  09/25/19 0319 127/90 98.4 F (36.9 C) Oral (!) 106 18 95 %  09/24/19 2252 123/72 97.8 F (36.6 C) Oral (!) 105 18 95 %  09/24/19 1910 120/75 98 F (36.7 C) Oral (!) 113 18 95 %  09/24/19 1636 (!) 142/91 98.9 F (37.2 C) Oral (!) 120 18 --  09/24/19 1130 (!) 141/81 98.5 F (36.9 C) Oral (!) 112 18 --  09/24/19 0847 (!) 145/94 98.6 F (37 C) Oral (!) 112 18 97 %   General: NAD, holding baby Pulmonary: no increased work of breathing Lochia: appropriate   CBC Latest Ref Rng & Units 09/25/2019 09/24/2019 09/24/2019  WBC 4.0 - 10.5 K/uL 15.8(H) - 17.9(H)  Hemoglobin 12.0 - 15.0 g/dL 6.9(L) 7.4(L) 7.7(L)  Hematocrit 36 - 46 % 21.0(L) 21.9(L) 22.4(L)  Platelets 150 - 400 K/uL 255 - 223    Assessment:   23 y.o. G1P1001 postoperativeday # 2  Acute blood loss anemia-hemoglobin 6.9 gm/dl today. Tachycardia persists. Discussed blood transfusion with patient and she is amenable to that plan. Will order infusion of 1 unit and recheck H&H  Preeclampsia without severe features: Began labetalol 100 mgm BID last night for mild range blood pressures. Since then has been normotensive. Continue labetalol 100 mgm BID.  11/25/2019, CNM

## 2019-09-26 LAB — CBC WITH DIFFERENTIAL/PLATELET
Abs Immature Granulocytes: 0.42 K/uL — ABNORMAL HIGH (ref 0.00–0.07)
Basophils Absolute: 0.1 K/uL (ref 0.0–0.1)
Basophils Relative: 1 %
Eosinophils Absolute: 0.5 K/uL (ref 0.0–0.5)
Eosinophils Relative: 3 %
HCT: 23.3 % — ABNORMAL LOW (ref 36.0–46.0)
Hemoglobin: 7.7 g/dL — ABNORMAL LOW (ref 12.0–15.0)
Immature Granulocytes: 3 %
Lymphocytes Relative: 15 %
Lymphs Abs: 2.5 K/uL (ref 0.7–4.0)
MCH: 29.3 pg (ref 26.0–34.0)
MCHC: 33 g/dL (ref 30.0–36.0)
MCV: 88.6 fL (ref 80.0–100.0)
Monocytes Absolute: 0.9 K/uL (ref 0.1–1.0)
Monocytes Relative: 5 %
Neutro Abs: 12 K/uL — ABNORMAL HIGH (ref 1.7–7.7)
Neutrophils Relative %: 73 %
Platelets: 325 K/uL (ref 150–400)
RBC: 2.63 MIL/uL — ABNORMAL LOW (ref 3.87–5.11)
RDW: 16.1 % — ABNORMAL HIGH (ref 11.5–15.5)
WBC: 16.4 K/uL — ABNORMAL HIGH (ref 4.0–10.5)
nRBC: 0.2 % (ref 0.0–0.2)

## 2019-09-26 LAB — TYPE AND SCREEN
ABO/RH(D): O POS
Antibody Screen: NEGATIVE
Unit division: 0

## 2019-09-26 LAB — BPAM RBC
Blood Product Expiration Date: 202108052359
ISSUE DATE / TIME: 202107080851
Unit Type and Rh: 5100

## 2019-09-26 MED ORDER — LABETALOL HCL 100 MG PO TABS: 100.0000 mg | ORAL_TABLET | Freq: Two times a day (BID) | ORAL | 1 refills | Status: DC

## 2019-09-26 MED ORDER — LABETALOL HCL 100 MG PO TABS: 200.0000 mg | ORAL_TABLET | Freq: Two times a day (BID) | ORAL | 1 refills | Status: DC

## 2019-09-26 MED ORDER — OXYCODONE HCL 5 MG PO TABS: 5.0000 mg | ORAL_TABLET | Freq: Four times a day (QID) | ORAL | 0 refills | Status: AC | PRN

## 2019-09-26 NOTE — Progress Notes (Signed)
DC inst reviewed with pt.  Verb u/o of f/u care for self and NB.  Verb u/o of increased dose of Labeletol for home use.  DC to home to car via Insurance underwriter.

## 2019-10-06 ENCOUNTER — Other Ambulatory Visit: Payer: Self-pay

## 2019-10-06 ENCOUNTER — Ambulatory Visit (INDEPENDENT_AMBULATORY_CARE_PROVIDER_SITE_OTHER): Payer: BC Managed Care – PPO | Admitting: Obstetrics and Gynecology

## 2019-10-06 ENCOUNTER — Encounter: Payer: Self-pay | Admitting: Obstetrics and Gynecology

## 2019-10-06 DIAGNOSIS — Z98891 History of uterine scar from previous surgery: Secondary | ICD-10-CM

## 2019-10-06 NOTE — Progress Notes (Signed)
  OBSTETRICS POSTPARTUM CLINIC PROGRESS NOTE  Subjective:     Jessica Waters is a 24 y.o. G49P1001 female who presents for a postpartum visit. She is 2 weeks postpartum following a Term pregnancy and delivery by C-section failure to progress.  I have fully reviewed the prenatal and intrapartum course. Anesthesia: epidural.  Postpartum course has been complicated by uncomplicated.  Baby is feeding by Breast.  Bleeding: patient has not  resumed menses.  Bowel function is normal. Bladder function is normal.  Patient is not sexually active. Contraception method desired is condoms.  Postpartum depression screening:  Edinburgh 9. Patient reports she is managing well with her mood. Hx of anxiety/depression.  The following portions of the patient's history were reviewed and updated as appropriate: allergies, current medications, past family history, past medical history, past social history, past surgical history and problem list.  Review of Systems Pertinent items are noted in HPI.  Objective:    BP 130/78   Pulse 93   Resp 16   Ht 4\' 11"  (1.499 m)   Wt 140 lb (63.5 kg)   LMP 01/03/2019 (Exact Date)   SpO2 99%   BMI 28.28 kg/m   General:  alert and no distress   Breasts:  inspection negative, no nipple discharge or bleeding, no masses or nodularity palpable  Lungs: clear to auscultation bilaterally  Heart:  regular rate and rhythm, S1, S2 normal, no murmur, click, rub or gallop  Abdomen: soft, non-tender; bowel sounds normal; no masses,  no organomegaly.   Well healed Pfannenstiel incision                            Assessment:  Post Partum Care visit 1. Postpartum care and examination    2. S/P cesarean section     Plan:  See orders and Patient Instructions Follow up in: 4 weeks or as needed.   01/05/2019 MD, [facility], [facility] 10/06/2019 3:52 PM

## 2019-10-06 NOTE — Patient Instructions (Signed)
Iron-Rich Diet  Iron is a mineral that helps your body to produce hemoglobin. Hemoglobin is a protein in red blood cells that carries oxygen to your body's tissues. Eating too little iron may cause you to feel weak and tired, and it can increase your risk of infection. Iron is naturally found in many foods, and many foods have iron added to them (iron-fortified foods). You may need to follow an iron-rich diet if you do not have enough iron in your body due to certain medical conditions. The amount of iron that you need each day depends on your age, your sex, and any medical conditions you have. Follow instructions from your health care provider or a diet and nutrition specialist (dietitian) about how much iron you should eat each day. What are tips for following this plan? Reading food labels  Check food labels to see how many milligrams (mg) of iron are in each serving. Cooking  Cook foods in pots and pans that are made from iron.  Take these steps to make it easier for your body to absorb iron from certain foods: ? Soak beans overnight before cooking. ? Soak whole grains overnight and drain them before using. ? Ferment flours before baking, such as by using yeast in bread dough. Meal planning  When you eat foods that contain iron, you should eat them with foods that are high in vitamin C. These include oranges, peppers, tomatoes, potatoes, and mango. Vitamin C helps your body to absorb iron. General information  Take iron supplements only as told by your health care provider. An overdose of iron can be life-threatening. If you were prescribed iron supplements, take them with orange juice or a vitamin C supplement.  When you eat iron-fortified foods or take an iron supplement, you should also eat foods that naturally contain iron, such as meat, poultry, and fish. Eating naturally iron-rich foods helps your body to absorb the iron that is added to other foods or contained in a  supplement.  Certain foods and drinks prevent your body from absorbing iron properly. Avoid eating these foods in the same meal as iron-rich foods or with iron supplements. These foods include: ? Coffee, black tea, and red wine. ? Milk, dairy products, and foods that are high in calcium. ? Beans and soybeans. ? Whole grains. What foods should I eat? Fruits Prunes. Raisins. Eat fruits high in vitamin C, such as oranges, grapefruits, and strawberries, alongside iron-rich foods. Vegetables Spinach (cooked). Green peas. Broccoli. Fermented vegetables. Eat vegetables high in vitamin C, such as leafy greens, potatoes, bell peppers, and tomatoes, alongside iron-rich foods. Grains Iron-fortified breakfast cereal. Iron-fortified whole-wheat bread. Enriched rice. Sprouted grains. Meats and other proteins Beef liver. Oysters. Beef. Shrimp. Turkey. Chicken. Tuna. Sardines. Chickpeas. Nuts. Tofu. Pumpkin seeds. Beverages Tomato juice. Fresh orange juice. Prune juice. Hibiscus tea. Fortified instant breakfast shakes. Sweets and desserts Blackstrap molasses. Seasonings and condiments Tahini. Fermented soy sauce. Other foods Wheat germ. The items listed above may not be a complete list of recommended foods and beverages. Contact a dietitian for more information. What foods should I avoid? Grains Whole grains. Bran cereal. Bran flour. Oats. Meats and other proteins Soybeans. Products made from soy protein. Black beans. Lentils. Mung beans. Split peas. Dairy Milk. Cream. Cheese. Yogurt. Cottage cheese. Beverages Coffee. Black tea. Red wine. Sweets and desserts Cocoa. Chocolate. Ice cream. Other foods Basil. Oregano. Large amounts of parsley. The items listed above may not be a complete list of foods and beverages to avoid.   Contact a dietitian for more information. Summary  Iron is a mineral that helps your body to produce hemoglobin. Hemoglobin is a protein in red blood cells that carries  oxygen to your body's tissues.  Iron is naturally found in many foods, and many foods have iron added to them (iron-fortified foods).  When you eat foods that contain iron, you should eat them with foods that are high in vitamin C. Vitamin C helps your body to absorb iron.  Certain foods and drinks prevent your body from absorbing iron properly, such as whole grains and dairy products. You should avoid eating these foods in the same meal as iron-rich foods or with iron supplements. This information is not intended to replace advice given to you by your health care provider. Make sure you discuss any questions you have with your health care provider. Document Revised: 02/16/2017 Document Reviewed: 01/30/2017 Elsevier Patient Education  2020 [employer].   Postpartum Baby Blues The postpartum period begins right after the birth of a baby. During this time, there is often a lot of joy and excitement. It is also a time of many changes in the life of the parents. No matter how many times a mother gives birth, each child brings new challenges to the family, including different ways of relating to one another. It is common to have feelings of excitement along with confusing changes in moods, emotions, and thoughts. You may feel happy one minute and sad or stressed the next. These feelings of sadness usually happen in the period right after you have your baby, and they go away within a week or two. This is called the "baby blues." What are the causes? There is no known cause of baby blues. It is likely caused by a combination of factors. However, changes in hormone levels after childbirth are believed to trigger some of the symptoms. Other factors that can play a role in these mood changes include:  Lack of sleep.  Stressful life events, such as poverty, caring for a loved one, or death of a loved one.  Genetics. What are the signs or symptoms? Symptoms of this condition include:  Brief changes  in mood, such as going from extreme happiness to sadness.  Decreased concentration.  Difficulty sleeping.  Crying spells and tearfulness.  Loss of appetite.  Irritability.  Anxiety. If the symptoms of baby blues last for more than 2 weeks or become more severe, you may have postpartum depression. How is this diagnosed? This condition is diagnosed based on an evaluation of your symptoms. There are no medical or lab tests that lead to a diagnosis, but there are various questionnaires that a health care provider may use to identify women with the baby blues or postpartum depression. How is this treated? Treatment is not needed for this condition. The baby blues usually go away on their own in 1-2 weeks. Social support is often all that is needed. You will be encouraged to get adequate sleep and rest. Follow these instructions at home: Lifestyle      Get as much rest as you can. Take a nap when the baby sleeps.  Exercise regularly as told by your health care provider. Some women find yoga and walking to be helpful.  Eat a balanced and nourishing diet. This includes plenty of fruits and vegetables, whole grains, and lean proteins.  Do little things that you enjoy. Have a cup of tea, take a bubble bath, read your favorite magazine, or listen to your favorite  music.  Avoid alcohol.  Ask for help with household chores, cooking, grocery shopping, or running errands. Do not try to do everything yourself. Consider hiring a postpartum doula to help. This is a professional who specializes in providing support to new mothers.  Try not to make any major life changes during pregnancy or right after giving birth. This can add stress. General instructions  Talk to people close to you about how you are feeling. Get support from your partner, family members, friends, or other new moms. You may want to join a support group.  Find ways to cope with stress. This may include: ? Writing your  thoughts and feelings in a journal. ? Spending time outside. ? Spending time with people who make you laugh.  Try to stay positive in how you think. Think about the things you are grateful for.  Take over-the-counter and prescription medicines only as told by your health care provider.  Let your health care provider know if you have any concerns.  Keep all postpartum visits as told by your health care provider. This is important. Contact a health care provider if:  Your baby blues do not go away after 2 weeks. Get help right away if:  You have thoughts of taking your own life (suicidal thoughts).  You think you may harm the baby or other people.  You see or hear things that are not there (hallucinations). Summary  After giving birth, you may feel happy one minute and sad or stressed the next. Feelings of sadness that happen right after the baby is born and go away after a week or two are called the "baby blues."  You can manage the baby blues by getting enough rest, eating a healthy diet, exercising, spending time with supportive people, and finding ways to cope with stress.  If feelings of sadness and stress last longer than 2 weeks or get in the way of caring for your baby, talk to your health care provider. This may mean you have postpartum depression. This information is not intended to replace advice given to you by your health care provider. Make sure you discuss any questions you have with your health care provider. Document Revised: 06/28/2018 Document Reviewed: 05/02/2016 Elsevier Patient Education  2020 [employer].

## 2019-11-03 ENCOUNTER — Other Ambulatory Visit: Payer: Self-pay

## 2019-11-03 ENCOUNTER — Encounter: Payer: Self-pay | Admitting: Obstetrics and Gynecology

## 2019-11-03 ENCOUNTER — Ambulatory Visit (INDEPENDENT_AMBULATORY_CARE_PROVIDER_SITE_OTHER): Payer: BC Managed Care – PPO | Admitting: Obstetrics and Gynecology

## 2019-11-03 VITALS — BP 118/70 | Ht 59.0 in | Wt 136.0 lb

## 2019-11-03 DIAGNOSIS — F411 Generalized anxiety disorder: Secondary | ICD-10-CM

## 2019-11-03 MED ORDER — CLONAZEPAM 0.5 MG PO TABS: 0.5000 mg | ORAL_TABLET | Freq: Two times a day (BID) | ORAL | 5 refills | Status: DC

## 2019-11-03 NOTE — Patient Instructions (Signed)
Managing Anxiety, Adult After being diagnosed with an anxiety disorder, you may be relieved to know why you have felt or behaved a certain way. You may also feel overwhelmed about the treatment ahead and what it will mean for your life. With care and support, you can manage this condition and recover from it. How to manage lifestyle changes Managing stress and anxiety  Stress is your body's reaction to life changes and events, both good and bad. Most stress will last just a few hours, but stress can be ongoing and can lead to more than just stress. Although stress can play a major role in anxiety, it is not the same as anxiety. Stress is usually caused by something external, such as a deadline, test, or competition. Stress normally passes after the triggering event has ended.  Anxiety is caused by something internal, such as imagining a terrible outcome or worrying that something will go wrong that will devastate you. Anxiety often does not go away even after the triggering event is over, and it can become long-term (chronic) worry. It is important to understand the differences between stress and anxiety and to manage your stress effectively so that it does not lead to an anxious response. Talk with your health care provider or a counselor to learn more about reducing anxiety and stress. He or she may suggest tension reduction techniques, such as:  Music therapy. This can include creating or listening to music that you enjoy and that inspires you.  Mindfulness-based meditation. This involves being aware of your normal breaths while not trying to control your breathing. It can be done while sitting or walking.  Centering prayer. This involves focusing on a word, phrase, or sacred image that means something to you and brings you peace.  Deep breathing. To do this, expand your stomach and inhale slowly through your nose. Hold your breath for 3-5 seconds. Then exhale slowly, letting your stomach muscles  relax.  Self-talk. This involves identifying thought patterns that lead to anxiety reactions and changing those patterns.  Muscle relaxation. This involves tensing muscles and then relaxing them. Choose a tension reduction technique that suits your lifestyle and personality. These techniques take time and practice. Set aside 5-15 minutes a day to do them. Therapists can offer counseling and training in these techniques. The training to help with anxiety may be covered by some insurance plans. Other things you can do to manage stress and anxiety include:  Keeping a stress/anxiety diary. This can help you learn what triggers your reaction and then learn ways to manage your response.  Thinking about how you react to certain situations. You may not be able to control everything, but you can control your response.  Making time for activities that help you relax and not feeling guilty about spending your time in this way.  Visual imagery and yoga can help you stay calm and relax.  Medicines Medicines can help ease symptoms. Medicines for anxiety include:  Anti-anxiety drugs.  Antidepressants. Medicines are often used as a primary treatment for anxiety disorder. Medicines will be prescribed by a health care provider. When used together, medicines, psychotherapy, and tension reduction techniques may be the most effective treatment. Relationships Relationships can play a big part in helping you recover. Try to spend more time connecting with trusted friends and family members. Consider going to couples counseling, taking family education classes, or going to family therapy. Therapy can help you and others better understand your condition. How to recognize changes in your   anxiety Everyone responds differently to treatment for anxiety. Recovery from anxiety happens when symptoms decrease and stop interfering with your daily activities at home or work. This may mean that you will start to:  Have  better concentration and focus. Worry will interfere less in your daily thinking.  Sleep better.  Be less irritable.  Have more energy.  Have improved memory. It is important to recognize when your condition is getting worse. Contact your health care provider if your symptoms interfere with home or work and you feel like your condition is not improving. Follow these instructions at home: Activity  Exercise. Most adults should do the following: ? Exercise for at least 150 minutes each week. The exercise should increase your heart rate and make you sweat (moderate-intensity exercise). ? Strengthening exercises at least twice a week.  Get the right amount and quality of sleep. Most adults need 7-9 hours of sleep each night. Lifestyle   Eat a healthy diet that includes plenty of vegetables, fruits, whole grains, low-fat dairy products, and lean protein. Do not eat a lot of foods that are high in solid fats, added sugars, or salt.  Make choices that simplify your life.  Do not use any products that contain nicotine or tobacco, such as cigarettes, e-cigarettes, and chewing tobacco. If you need help quitting, ask your health care provider.  Avoid caffeine, alcohol, and certain over-the-counter cold medicines. These may make you feel worse. Ask your pharmacist which medicines to avoid. General instructions  Take over-the-counter and prescription medicines only as told by your health care provider.  Keep all follow-up visits as told by your health care provider. This is important. Where to find support You can get help and support from these sources:  Self-help groups.  Online and community organizations.  A trusted spiritual leader.  Couples counseling.  Family education classes.  Family therapy. Where to find more information You may find that joining a support group helps you deal with your anxiety. The following sources can help you locate counselors or support groups near  you:  Mental Health America: www.mentalhealthamerica.net  Anxiety and Depression Association of America (ADAA): www.adaa.org  National Alliance on Mental Illness (NAMI): www.nami.org Contact a health care provider if you:  Have a hard time staying focused or finishing daily tasks.  Spend many hours a day feeling worried about everyday life.  Become exhausted by worry.  Start to have headaches, feel tense, or have nausea.  Urinate more than normal.  Have diarrhea. Get help right away if you have:  A racing heart and shortness of breath.  Thoughts of hurting yourself or others. If you ever feel like you may hurt yourself or others, or have thoughts about taking your own life, get help right away. You can go to your nearest emergency department or call:  Your local emergency services (911 in the [place]  A suicide crisis helpline, such as the National Suicide Prevention Lifeline at 1-800-273-8255. This is open 24 hours a day. Summary  Taking steps to learn and use tension reduction techniques can help calm you and help prevent triggering an anxiety reaction.  When used together, medicines, psychotherapy, and tension reduction techniques may be the most effective treatment.  Family, friends, and partners can play a big part in helping you recover from an anxiety disorder. This information is not intended to replace advice given to you by your health care provider. Make sure you discuss any questions you have with your health care provider. Document Revised:   08/06/2018 Document Reviewed: 08/06/2018 Elsevier Patient Education  2020 [employer]   Perinatal Anxiety When a woman feels excessive tension or worry (anxiety) during pregnancy or during the first 12 months after she gives birth, she has a condition called perinatal anxiety. Anxiety can interfere with work, school, relationships, and other everyday activities. If it is not managed properly, it can also cause problems in  the mother and her baby.  If you are pregnant and you have symptoms of an anxiety disorder, it is important to talk with your health care provider. What are the causes? The exact cause of this condition is not known. Hormonal changes during and after pregnancy may play a role in causing perinatal anxiety. What increases the risk? You are more likely to develop this condition if:  You have a personal or family history of depression, anxiety, or mood disorders.  You experience a stressful life event during pregnancy, such as the death of a loved one.  You have a lot of regular life stress, such as being a single parent.  You have thyroid problems. What are the signs or symptoms? Perinatal anxiety can be different for everyone. It may include:  Panic attacks (panic disorder). These are intense episodes of fear or discomfort that may also cause sweating, nausea, shortness of breath, or fear of dying. They usually last 5-15 minutes.  Reliving an upsetting (traumatic) event through distressing thoughts, dreams, or flashbacks (post-traumatic stress disorder, or PTSD).  Excessive worry about multiple problems (generalized anxiety disorder).  Fear and stress about leaving certain people or loved ones (separation anxiety).  Performing repetitive tasks (compulsions) to relieve stress or worry (obsessive compulsive disorder, or OCD).  Fear of certain objects or situations (phobias).  Excessive worrying, such as a constant feeling that something bad is going to happen.  Inability to relax.  Difficulty concentrating.  Sleep problems.  Frequent nightmares or disturbing thoughts. How is this diagnosed? This condition is diagnosed based on a physical exam and mental evaluation. In some cases, your health care provider may use an anxiety screening tool. These tools include a list of questions that can help a health care provider diagnose anxiety. Your health care provider may refer you to a  mental health expert who specializes in anxiety. How is this treated? This condition may be treated with:  Medicines. Your health care provider will only give you medicines that have been proven safe for pregnancy and breastfeeding.  Talk therapy with a mental health professional to help change your patterns of thinking (cognitive behavioral therapy).  Mindfulness-based stress reduction.  Other relaxation therapies, such as deep breathing or guided muscle relaxation.  Support groups. Follow these instructions at home: Lifestyle  Do not use any products that contain nicotine or tobacco, such as cigarettes and e-cigarettes. If you need help quitting, ask your health care provider.  Do not use alcohol when you are pregnant. After your baby is born, limit alcohol intake to no more than 1 drink a day. One drink equals 12 oz of beer, 5 oz of wine, or 1 oz of hard liquor.  Consider joining a support group for new mothers. Ask your health care provider for recommendations.  Take good care of yourself. Make sure you: ? Get plenty of sleep. If you are having trouble sleeping, talk with your health care provider. ? Eat a healthy diet. This includes plenty of fruits and vegetables, whole grains, and lean proteins. ? Exercise regularly, as told by your health care provider. Ask your  health care provider what exercises are safe for you. General instructions  Take over-the-counter and prescription medicines only as told by your health care provider.  Talk with your partner or family members about your feelings during pregnancy. Share any concerns or fears that you may have.  Ask for help with tasks or chores when you need it. Ask friends and family members to provide meals, watch your children, or help with cleaning.  Keep all follow-up visits as told by your health care provider. This is important. Contact a health care provider if:  You (or people close to you) notice that you have any  symptoms of anxiety or depression.  You have anxiety and your symptoms get worse.  You experience side effects from medicines, such as nausea or sleep problems. Get help right away if:  You feel like hurting yourself, your baby, or someone else. If you ever feel like you may hurt yourself or others, or have thoughts about taking your own life, get help right away. You can go to your nearest emergency department or call:  Your local emergency services (911 in the [place]  A suicide crisis helpline, such as the National Suicide Prevention Lifeline at 970-713-9571. This is open 24 hours a day. Summary  Perinatal anxiety is when a woman feels excessive tension or worry during pregnancy or during the first 12 months after she gives birth.  Perinatal anxiety may include panic attacks, post-traumatic stress disorder, separation anxiety, phobias, or generalized anxiety.  Perinatal anxiety can cause physical health problems in the mother and baby if not properly managed.  This condition is treated with medicines, talk therapy, stress reduction therapies, or a combination of two or more treatments.  Talk with your partner or family members about your concerns or fears. Do not be afraid to ask for help. This information is not intended to replace advice given to you by your health care provider. Make sure you discuss any questions you have with your health care provider. Document Revised: 03/09/2017 Document Reviewed: 05/03/2016 Elsevier Patient Education  2020 [employer].

## 2019-11-03 NOTE — Progress Notes (Signed)
  OBSTETRICS POSTPARTUM CLINIC PROGRESS NOTE  Subjective:     Jessica Waters is a 24 y.o. G21P1001 female who presents for a postpartum visit. She is 6 weeks postpartum following a Term pregnancy and delivery by C-section.  I have fully reviewed the prenatal and intrapartum course. Anesthesia: spinal.  Postpartum course has been complicated by complicated by postpartum anxiety.  Baby is feeding by Bottle.  Bleeding: patient has not  resumed menses.  Bowel function is normal. Bladder function is normal.  Patient is not sexually active. Contraception method desired is condoms.  Postpartum depression screening: Reports worsened anxiety. She has taken Effexor for this and is generally well controlled but recently has noticed it worsening. She reports that in the past she used klonopin for anxiety and it helped a lot. She would like to restart that medication.  The following portions of the patient's history were reviewed and updated as appropriate: allergies, current medications, past family history, past medical history, past social history, past surgical history and problem list.  Review of Systems Pertinent items are noted in HPI.  Objective:    BP 118/70   Ht 4\' 11"  (1.499 m)   Wt 136 lb (61.7 kg)   LMP 01/03/2019 (Exact Date)   Breastfeeding No   BMI 27.47 kg/m   General:  alert and no distress   Breasts:  inspection negative, no nipple discharge or bleeding, no masses or nodularity palpable  Lungs: clear to auscultation bilaterally  Heart:  regular rate and rhythm, S1, S2 normal, no murmur, click, rub or gallop  Abdomen: soft, non-tender; bowel sounds normal; no masses,  no organomegaly.   Well healed Pfannenstiel incision                            Assessment:  Post Partum Care visit 1. Anxiety, generalized   - clonazePAM (KLONOPIN) 0.5 MG tablet; Take 1 tablet (0.5 mg total) by mouth 2 (two) times daily.  Dispense: 60 tablet; Refill: 5 - Ambulatory referral to  Psychiatry  2. Postpartum care and examination    3. Cesarean delivery delivered     Plan:  See orders and Patient Instructions Follow up in: 12 months  or as needed.   01/05/2019 MD, Adelene Idler [facility], [facility] 11/03/2019 8:29 PM

## 2020-03-24 ENCOUNTER — Other Ambulatory Visit: Payer: Self-pay

## 2020-03-24 ENCOUNTER — Ambulatory Visit: Payer: BC Managed Care – PPO | Admitting: Advanced Practice Midwife

## 2020-03-25 ENCOUNTER — Ambulatory Visit (INDEPENDENT_AMBULATORY_CARE_PROVIDER_SITE_OTHER): Payer: BC Managed Care – PPO | Admitting: Obstetrics and Gynecology

## 2020-03-25 ENCOUNTER — Encounter: Payer: Self-pay | Admitting: Obstetrics and Gynecology

## 2020-03-25 DIAGNOSIS — O99345 Other mental disorders complicating the puerperium: Secondary | ICD-10-CM | POA: Diagnosis not present

## 2020-03-25 DIAGNOSIS — F332 Major depressive disorder, recurrent severe without psychotic features: Secondary | ICD-10-CM | POA: Diagnosis not present

## 2020-03-25 DIAGNOSIS — F53 Postpartum depression: Secondary | ICD-10-CM

## 2020-03-25 MED ORDER — VENLAFAXINE HCL ER 75 MG PO CP24: 150.0000 mg | ORAL_CAPSULE | Freq: Every day | ORAL | 11 refills | Status: DC

## 2020-03-25 NOTE — Progress Notes (Signed)
Virtual Visit via Telephone Note  I connected with Jessica Waters on 03/25/20 at  3:50 PM EST by telephone and verified that I am speaking with the correct person using two identifiers.   I discussed the limitations, risks, security and privacy concerns of performing an evaluation and management service by telephone and the availability of in person appointments. I also discussed with the patient that there may be a patient responsible charge related to this service. The patient expressed understanding and agreed to proceed.  The patient was in her car I spoke with the patient from my  office The names of people involved in this encounter were: Ervin A Watters and Dr. Jerene Pitch.  History of Present Illness: She made an appointment because she has been having issues with depression.   She was having more bad days then good days.  Anxiety and Depression has always been an issue for the patient but she recently started work again which is contributing.   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  She reports her baby is colicky and is demanding requiring a lot of attention.  She has been juggling a lot.  She feels like at work she can't express herself so she feels like she is more "down at home" because she can't express those emotions at homw.   She has been taking Effexor 75 mg a day and Klonopin for 4-5 years.   She is taking the klonopin daily, she feels like this helps some.   She does have a work Personnel officer who she is close with and is supportive.   She has had thoughts of self harm. She has expressed that to her family. She does not plan to act on thee thoughts. She has made a safety plan for if these thoughts occur again.      Observations/Objective: Physical Exam could not be performed. Because of the COVID-19 outbreak this visit was performed over the phone and not in person.   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  She will consider establishing with a therapist. Provider her with a lost of resources.  Provided information for local psychiatrist to consult with regarding medication regimen.  Follow Up Instructions: 4 weeks   I discussed the assessment and treatment plan with the patient. The patient was provided an opportunity to ask questions and all were answered. The patient agreed with the plan and demonstrated an understanding of the instructions.   The patient was advised to call back or seek an in-person evaluation if the symptoms worsen or if the condition fails to improve as anticipated.  I provided 25 minutes of non-face-to-face time during this encounter.  Adelene Idler MD [facility], [facility] 03/25/2020 4:43 PM

## 2020-03-25 NOTE — Patient Instructions (Addendum)
IV infusion medication: Brisdelle  Better Help : betterhelp.com  Cephus Shelling MD [address] [place]   [place], [place] Same Day Access Hours:  Monday, Wednesday and Friday, 8am - 3pm Walk-In Crisis Hours: 7 days/wk, 8am - 8pm [address], [place], [place] [address] [phone]  Mobile Crisis Unit [phone]   Therapists/Counselors/Psychologists  [facility] [address], [facility] [address] [place], [place] [address] [phone]  Karen San [place], [place]    [phone]        [place], [place] [address]        Peggye Ley, [place] [phone] [place], [place] [address]  Lady Deutscher, [place]        [phone]        [address]      [place], [place] [address]        Bettey Mare, [place] [phone] [address] [place], [place] [address]   Dimas Aguas, LMFT       [phone]        [address], [place] [address]        Miguel Dibble [phone] [address] [place], [place] [address]  Rhona Raider        [phone]        [address]       [place], [place] [address]        Sena Hitch [phone] [address] [place], [place] [address]  Lestine Box, [place]       [phone]        [address]       [place], [place] [address]        Marjie Skiff, [place] [phone] [address]  [place], [place] [address]   Einar Gip        [phone]        [address] [place],  [address]         [place] [phone] [email or web address]   Sation Konchella       [phone]        [address],  [address]        [place] [phone] [email or web address]    Living With Depression Everyone experiences occasional  disappointment, sadness, and loss in their lives. When you are feeling down, blue, or sad for at least 2 weeks in a row, it may mean that you have depression. Depression can affect your thoughts and feelings, relationships, daily activities, and physical health. It is caused by changes in the way your brain functions. If you receive a diagnosis of depression, your health care provider will tell you which type of depression you have and what treatment options are available to you. If you are living with depression, there are ways to help you recover from it and also ways to prevent it from coming back. How to cope with lifestyle changes Coping with stress     Stress is your body's reaction to life changes and events, both good and bad. Stressful situations may include:  Getting married.  The death of a spouse.  Losing a job.  Retiring.  Having a baby. Stress can last just  a few hours or it can be ongoing. Stress can play a major role in depression, so it is important to learn both how to cope with stress and how to think about it differently. Talk with your health care provider or a counselor if you would like to learn more about stress reduction. He or she may suggest some stress reduction techniques, such as:  Music therapy. This can include creating music or listening to music. Choose music that you enjoy and that inspires you.  Mindfulness-based meditation. This kind of meditation can be done while sitting or walking. It involves being aware of your normal breaths, rather than trying to control your breathing.  Centering prayer. This is a kind of meditation that involves focusing on a spiritual word or phrase. Choose a word, phrase, or sacred image that is meaningful to you and that brings you peace.  Deep breathing. To do this, expand your stomach and inhale slowly through your nose. Hold your breath for 3-5 seconds, then exhale slowly, allowing your stomach muscles to relax.  Muscle  relaxation. This involves intentionally tensing muscles then relaxing them. Choose a stress reduction technique that fits your lifestyle and personality. Stress reduction techniques take time and practice to develop. Set aside 5-15 minutes a day to do them. Therapists can offer training in these techniques. The training may be covered by some insurance plans. Other things you can do to manage stress include:  Keeping a stress diary. This can help you learn what triggers your stress and ways to control your response.  Understanding what your limits are and saying no to requests or events that lead to a schedule that is too full.  Thinking about how you respond to certain situations. You may not be able to control everything, but you can control how you react.  Adding humor to your life by watching funny films or TV shows.  Making time for activities that help you relax and not feeling guilty about spending your time this way.  Medicines Your health care provider may suggest certain medicines if he or she feels that they will help improve your condition. Avoid using alcohol and other substances that may prevent your medicines from working properly (may interact). It is also important to:  Talk with your pharmacist or health care provider about all the medicines that you take, their possible side effects, and what medicines are safe to take together.  Make it your goal to take part in all treatment decisions (shared decision-making). This includes giving input on the side effects of medicines. It is best if shared decision-making with your health care provider is part of your total treatment plan. If your health care provider prescribes a medicine, you may not notice the full benefits of it for 4-8 weeks. Most people who are treated for depression need to be on medicine for at least 6-12 months after they feel better. If you are taking medicines as part of your treatment, do not stop taking medicines  without first talking to your health care provider. You may need to have the medicine slowly decreased (tapered) over time to decrease the risk of harmful side effects. Relationships Your health care provider may suggest family therapy along with individual therapy and drug therapy. While there may not be family problems that are causing you to feel depressed, it is still important to make sure your family learns as much as they can about your mental health. Having your family's support can help make your treatment  successful. How to recognize changes in your condition Everyone has a different response to treatment for depression. Recovery from major depression happens when you have not had signs of major depression for two months. This may mean that you will start to:  Have more interest in doing activities.  Feel less hopeless than you did 2 months ago.  Have more energy.  Overeat less often, or have better or improving appetite.  Have better concentration. Your health care provider will work with you to decide the next steps in your recovery. It is also important to recognize when your condition is getting worse. Watch for these signs:  Having fatigue or low energy.  Eating too much or too little.  Sleeping too much or too little.  Feeling restless, agitated, or hopeless.  Having trouble concentrating or making decisions.  Having unexplained physical complaints.  Feeling irritable, angry, or aggressive. Get help as soon as you or your family members notice these symptoms coming back. How to get support and help from others How to talk with friends and family members about your condition  Talking to friends and family members about your condition can provide you with one way to get support and guidance. Reach out to trusted friends or family members, explain your symptoms to them, and let them know that you are working with a health care provider to treat your  depression. Financial resources Not all insurance plans cover mental health care, so it is important to check with your insurance carrier. If paying for co-pays or counseling services is a problem, search for a local or county mental health care center. They may be able to offer public mental health care services at low or no cost when you are not able to see a private health care provider. If you are taking medicine for depression, you may be able to get the generic form, which may be less expensive. Some makers of prescription medicines also offer help to patients who cannot afford the medicines they need. Follow these instructions at home:   Get the right amount and quality of sleep.  Cut down on using caffeine, tobacco, alcohol, and other potentially harmful substances.  Try to exercise, such as walking or lifting small weights.  Take over-the-counter and prescription medicines only as told by your health care provider.  Eat a healthy diet that includes plenty of vegetables, fruits, whole grains, low-fat dairy products, and lean protein. Do not eat a lot of foods that are high in solid fats, added sugars, or salt.  Keep all follow-up visits as told by your health care provider. This is important. Contact a health care provider if:  You stop taking your antidepressant medicines, and you have any of these symptoms: ? Nausea. ? Headache. ? Feeling lightheaded. ? Chills and body aches. ? Not being able to sleep (insomnia).  You or your friends and family think your depression is getting worse. Get help right away if:  You have thoughts of hurting yourself or others. If you ever feel like you may hurt yourself or others, or have thoughts about taking your own life, get help right away. You can go to your nearest emergency department or call:  Your local emergency services (911 in the [place]  A suicide crisis helpline, such as the National Suicide Prevention Lifeline at  925-582-32541-276-857-7766. This is open 24-hours a day. Summary  If you are living with depression, there are ways to help you recover from it and also ways to prevent  it from coming back.  Work with your health care team to create a management plan that includes counseling, stress management techniques, and healthy lifestyle habits. This information is not intended to replace advice given to you by your health care provider. Make sure you discuss any questions you have with your health care provider. Document Revised: 06/28/2018 Document Reviewed: 02/07/2016 Elsevier Patient Education  2020 Elsevier Inc.   Managing Anxiety, Adult After being diagnosed with an anxiety disorder, you may be relieved to know why you have felt or behaved a certain way. You may also feel overwhelmed about the treatment ahead and what it will mean for your life. With care and support, you can manage this condition and recover from it. How to manage lifestyle changes Managing stress and anxiety  Stress is your body's reaction to life changes and events, both good and bad. Most stress will last just a few hours, but stress can be ongoing and can lead to more than just stress. Although stress can play a major role in anxiety, it is not the same as anxiety. Stress is usually caused by something external, such as a deadline, test, or competition. Stress normally passes after the triggering event has ended.  Anxiety is caused by something internal, such as imagining a terrible outcome or worrying that something will go wrong that will devastate you. Anxiety often does not go away even after the triggering event is over, and it can become long-term (chronic) worry. It is important to understand the differences between stress and anxiety and to manage your stress effectively so that it does not lead to an anxious response. Talk with your health care provider or a counselor to learn more about reducing anxiety and stress. He or she may  suggest tension reduction techniques, such as:  Music therapy. This can include creating or listening to music that you enjoy and that inspires you.  Mindfulness-based meditation. This involves being aware of your normal breaths while not trying to control your breathing. It can be done while sitting or walking.  Centering prayer. This involves focusing on a word, phrase, or sacred image that means something to you and brings you peace.  Deep breathing. To do this, expand your stomach and inhale slowly through your nose. Hold your breath for 3-5 seconds. Then exhale slowly, letting your stomach muscles relax.  Self-talk. This involves identifying thought patterns that lead to anxiety reactions and changing those patterns.  Muscle relaxation. This involves tensing muscles and then relaxing them. Choose a tension reduction technique that suits your lifestyle and personality. These techniques take time and practice. Set aside 5-15 minutes a day to do them. Therapists can offer counseling and training in these techniques. The training to help with anxiety may be covered by some insurance plans. Other things you can do to manage stress and anxiety include:  Keeping a stress/anxiety diary. This can help you learn what triggers your reaction and then learn ways to manage your response.  Thinking about how you react to certain situations. You may not be able to control everything, but you can control your response.  Making time for activities that help you relax and not feeling guilty about spending your time in this way.  Visual imagery and yoga can help you stay calm and relax.  Medicines Medicines can help ease symptoms. Medicines for anxiety include:  Anti-anxiety drugs.  Antidepressants. Medicines are often used as a primary treatment for anxiety disorder. Medicines will be prescribed by a health  care provider. When used together, medicines, psychotherapy, and tension reduction techniques  may be the most effective treatment. Relationships Relationships can play a big part in helping you recover. Try to spend more time connecting with trusted friends and family members. Consider going to couples counseling, taking family education classes, or going to family therapy. Therapy can help you and others better understand your condition. How to recognize changes in your anxiety Everyone responds differently to treatment for anxiety. Recovery from anxiety happens when symptoms decrease and stop interfering with your daily activities at home or work. This may mean that you will start to:  Have better concentration and focus. Worry will interfere less in your daily thinking.  Sleep better.  Be less irritable.  Have more energy.  Have improved memory. It is important to recognize when your condition is getting worse. Contact your health care provider if your symptoms interfere with home or work and you feel like your condition is not improving. Follow these instructions at home: Activity  Exercise. Most adults should do the following: ? Exercise for at least 150 minutes each week. The exercise should increase your heart rate and make you sweat (moderate-intensity exercise). ? Strengthening exercises at least twice a week.  Get the right amount and quality of sleep. Most adults need 7-9 hours of sleep each night. Lifestyle   Eat a healthy diet that includes plenty of vegetables, fruits, whole grains, low-fat dairy products, and lean protein. Do not eat a lot of foods that are high in solid fats, added sugars, or salt.  Make choices that simplify your life.  Do not use any products that contain nicotine or tobacco, such as cigarettes, e-cigarettes, and chewing tobacco. If you need help quitting, ask your health care provider.  Avoid caffeine, alcohol, and certain over-the-counter cold medicines. These may make you feel worse. Ask your pharmacist which medicines to avoid. General  instructions  Take over-the-counter and prescription medicines only as told by your health care provider.  Keep all follow-up visits as told by your health care provider. This is important. Where to find support You can get help and support from these sources:  Self-help groups.  Online and [employer].  A trusted spiritual leader.  Couples counseling.  Family education classes.  Family therapy. Where to find more information You may find that joining a support group helps you deal with your anxiety. The following sources can help you locate counselors or support groups near you:  Mental Health America: www.mentalhealthamerica.net  Anxiety and Depression Association of [place] (ADAA): ProgramCam.de  [employer] on Mental Illness (NAMI): www.nami.org Contact a health care provider if you:  Have a hard time staying focused or finishing daily tasks.  Spend many hours a day feeling worried about everyday life.  Become exhausted by worry.  Start to have headaches, feel tense, or have nausea.  Urinate more than normal.  Have diarrhea. Get help right away if you have:  A racing heart and shortness of breath.  Thoughts of hurting yourself or others. If you ever feel like you may hurt yourself or others, or have thoughts about taking your own life, get help right away. You can go to your nearest emergency department or call:  Your local emergency services (911 in the [place]  A suicide crisis helpline, such as the National Suicide Prevention Lifeline at 304-095-3586. This is open 24 hours a day. Summary  Taking steps to learn and use tension reduction techniques can help calm you and help  prevent triggering an anxiety reaction.  When used together, medicines, psychotherapy, and tension reduction techniques may be the most effective treatment.  Family, friends, and partners can play a big part in helping you recover from an anxiety disorder. This  information is not intended to replace advice given to you by your health care provider. Make sure you discuss any questions you have with your health care provider. Document Revised: 08/06/2018 Document Reviewed: 08/06/2018 Elsevier Patient Education  2020 [employer].

## 2020-04-22 ENCOUNTER — Encounter: Payer: Self-pay | Admitting: Obstetrics and Gynecology

## 2020-04-22 ENCOUNTER — Other Ambulatory Visit: Payer: Self-pay

## 2020-04-22 ENCOUNTER — Ambulatory Visit (INDEPENDENT_AMBULATORY_CARE_PROVIDER_SITE_OTHER): Payer: BC Managed Care – PPO | Admitting: Obstetrics and Gynecology

## 2020-04-22 VITALS — Ht 60.0 in | Wt 136.0 lb

## 2020-04-22 DIAGNOSIS — F411 Generalized anxiety disorder: Secondary | ICD-10-CM | POA: Diagnosis not present

## 2020-04-22 DIAGNOSIS — F332 Major depressive disorder, recurrent severe without psychotic features: Secondary | ICD-10-CM

## 2020-04-22 DIAGNOSIS — O99345 Other mental disorders complicating the puerperium: Secondary | ICD-10-CM

## 2020-04-22 DIAGNOSIS — F53 Postpartum depression: Secondary | ICD-10-CM | POA: Diagnosis not present

## 2020-04-22 NOTE — Progress Notes (Signed)
PHONE VISIT Med check

## 2020-04-22 NOTE — Progress Notes (Signed)
Virtual Visit via Telephone Note  I connected with Jessica Waters on 04/22/20 at  4:10 PM EST by telephone and verified that I am speaking with the correct person using two identifiers.   I discussed the limitations, risks, security and privacy concerns of performing an evaluation and management service by telephone and the availability of in person appointments. I also discussed with the patient that there may be a patient responsible charge related to this service. The patient expressed understanding and agreed to proceed.  The patient was at home I spoke with the patient from my  office The names of people involved in this encounter were: Nhung A Morr and Dr. Jerene Pitch.  History of Present Illness:  Feels like the medication has made her feel more irritable, but also feels like in the last week she has started to see more improvement in her mood. PHQ-9 score has decreased from 15 to 10.   There has been more challenges with school because of a lot of kids having COVID. She is at a [employer]. She herself has not yet had covid.  School was closed for a week last month.  Things have been tough but she feels like she has been able to handle things better. There has also been addition of mental health days in the calendar which will allow for three day weekend.    Her baby has started sleeping through the night in the last 2 weeks. She feels like she has been a happier baby overall, but still very demanding.   She has been having issues with going to sleep. Feels like her mind is racing when she goes to lay down. She does have a sleep routine which involves taking a bath, washing her face, and reading. She has tried melatonin which has not helped her. She took unisom during her pregnancy which did seem to help. She had not thought of trying that again.   She was considering a therapist- she has not been able to establish with one yet. She would like too.    Observations/Objective:  Physical Exam could not be performed. Because of the COVID-19 outbreak this visit was performed over the phone and not in person.   Assessment and Plan: 25 yo with depression and anxiety Recent medication increase with some improvement in mood.  Will continue current dose of Effexor and follow up in 4 weeks.  Will trial unisom for difficulty falling asleep.  Will consider establishing with therapist.  Discussed Mom-on- Call resources for help with infant schedule and normal parenting questions.    Follow Up Instructions: Phone visit in 4 weeks   I discussed the assessment and treatment plan with the patient. The patient was provided an opportunity to ask questions and all were answered. The patient agreed with the plan and demonstrated an understanding of the instructions.   The patient was advised to call back or seek an in-person evaluation if the symptoms worsen or if the condition fails to improve as anticipated.  I provided 20 minutes of non-face-to-face time during this encounter.  Adelene Idler MD [facility], [facility] 04/22/2020 4:06 PM

## 2020-05-20 ENCOUNTER — Encounter: Payer: Self-pay | Admitting: Obstetrics and Gynecology

## 2020-05-20 ENCOUNTER — Ambulatory Visit (INDEPENDENT_AMBULATORY_CARE_PROVIDER_SITE_OTHER): Payer: BC Managed Care – PPO | Admitting: Obstetrics and Gynecology

## 2020-05-20 VITALS — Ht 59.0 in | Wt 136.0 lb

## 2020-05-20 DIAGNOSIS — F418 Other specified anxiety disorders: Secondary | ICD-10-CM

## 2020-05-20 DIAGNOSIS — F411 Generalized anxiety disorder: Secondary | ICD-10-CM

## 2020-05-20 DIAGNOSIS — F53 Postpartum depression: Secondary | ICD-10-CM

## 2020-05-20 DIAGNOSIS — O99345 Other mental disorders complicating the puerperium: Secondary | ICD-10-CM

## 2020-05-20 NOTE — Patient Instructions (Signed)
http://APA.org/depression-guideline"> https://clinicalkey.com"> http://point-of-care.elsevierperformancemanager.com/skills/"> http://point-of-care.elsevierperformancemanager.com">  Managing Depression, Adult Depression is a mental health condition that affects your thoughts, feelings, and actions. Being diagnosed with depression can bring you relief if you did not know why you have felt or behaved a certain way. It could also leave you feeling overwhelmed with uncertainty about your future. Preparing yourself to manage your symptoms can help you feel more positive about your future. How to manage lifestyle changes Managing stress Stress is your body's reaction to life changes and events, both good and bad. Stress can add to your feelings of depression. Learning to manage your stress can help lessen your feelings of depression. Try some of the following approaches to reducing your stress (stress reduction techniques):  Listen to music that you enjoy and that inspires you.  Try using a meditation app or take a meditation class.  Develop a practice that helps you connect with your spiritual self. Walk in nature, pray, or go to a place of worship.  Do some deep breathing. To do this, inhale slowly through your nose. Pause at the top of your inhale for a few seconds and then exhale slowly, letting your muscles relax.  Practice yoga to help relax and work your muscles. Choose a stress reduction technique that suits your lifestyle and personality. These techniques take time and practice to develop. Set aside 5-15 minutes a day to do them. Therapists can offer training in these techniques. Other things you can do to manage stress include:  Keeping a stress diary.  Knowing your limits and saying no when you think something is too much.  Paying attention to how you react to certain situations. You may not be able to control everything, but you can change your reaction.  Adding humor to your life by  watching funny films or TV shows.  Making time for activities that you enjoy and that relax you.   Medicines Medicines, such as antidepressants, are often a part of treatment for depression.  Talk with your pharmacist or health care provider about all the medicines, supplements, and herbal products that you take, their possible side effects, and what medicines and other products are safe to take together.  Make sure to report any side effects you may have to your health care provider. Relationships Your health care provider may suggest family therapy, couples therapy, or individual therapy as part of your treatment. How to recognize changes Everyone responds differently to treatment for depression. As you recover from depression, you may start to:  Have more interest in doing activities.  Feel less hopeless.  Have more energy.  Overeat less often, or have a better appetite.  Have better mental focus. It is important to recognize if your depression is not getting better or is getting worse. The symptoms you had in the beginning may return, such as:  Tiredness (fatigue) or low energy.  Eating too much or too little.  Sleeping too much or too little.  Feeling restless, agitated, or hopeless.  Trouble focusing or making decisions.  Unexplained physical complaints.  Feeling irritable, angry, or aggressive. If you or your family members notice these symptoms coming back, let your health care provider know right away. Follow these instructions at home: Activity  Try to get some form of exercise each day, such as walking, biking, swimming, or lifting weights.  Practice stress reduction techniques.  Engage your mind by taking a class or doing some volunteer work.   Lifestyle  Get the right amount and quality of sleep.    Cut down on using caffeine, tobacco, alcohol, and other potentially harmful substances.  Eat a healthy diet that includes plenty of vegetables, fruits,  whole grains, low-fat dairy products, and lean protein. Do not eat a lot of foods that are high in solid fats, added sugars, or salt (sodium). General instructions  Take over-the-counter and prescription medicines only as told by your health care provider.  Keep all follow-up visits as told by your health care provider. This is important. Where to find support Talking to others Friends and family members can be sources of support and guidance. Talk to trusted friends or family members about your condition. Explain your symptoms to them, and let them know that you are working with a health care provider to treat your depression. Tell friends and family members how they also can be helpful.   Finances  Find appropriate mental health providers that fit with your financial situation.  Talk with your health care provider about options to get reduced prices on your medicines. Where to find more information You can find support in your area from:  Anxiety and Depression Association of America (ADAA): www.adaa.org  Mental Health America: www.mentalhealthamerica.net  National Alliance on Mental Illness: www.nami.org Contact a health care provider if:  You stop taking your antidepressant medicines, and you have any of these symptoms: ? Nausea. ? Headache. ? Light-headedness. ? Chills and body aches. ? Not being able to sleep (insomnia).  You or your friends and family think your depression is getting worse. Get help right away if:  You have thoughts of hurting yourself or others. If you ever feel like you may hurt yourself or others, or have thoughts about taking your own life, get help right away. Go to your nearest emergency department or:  Call your local emergency services (911 in the [place]  Call a suicide crisis helpline, such as the National Suicide Prevention Lifeline at 1-800-273-8255. This is open 24 hours a day in the [place]  Text the Crisis Text Line at 741741 (in the  [place] Summary  If you are diagnosed with depression, preparing yourself to manage your symptoms is a good way to feel positive about your future.  Work with your health care provider on a management plan that includes stress reduction techniques, medicines (if applicable), therapy, and healthy lifestyle habits.  Keep talking with your health care provider about how your treatment is working.  If you have thoughts about taking your own life, call a suicide crisis helpline or text a crisis text line. This information is not intended to replace advice given to you by your health care provider. Make sure you discuss any questions you have with your health care provider. Document Revised: 01/15/2019 Document Reviewed: 01/15/2019 Elsevier Patient Education  2021 Elsevier Inc. Managing Anxiety, Adult After being diagnosed with an anxiety disorder, you may be relieved to know why you have felt or behaved a certain way. You may also feel overwhelmed about the treatment ahead and what it will mean for your life. With care and support, you can manage this condition and recover from it. How to manage lifestyle changes Managing stress and anxiety Stress is your body's reaction to life changes and events, both good and bad. Most stress will last just a few hours, but stress can be ongoing and can lead to more than just stress. Although stress can play a major role in anxiety, it is not the same as anxiety. Stress is usually caused by something external, such as a deadline,   a deadline, test, or competition. Stress normally passes after the triggering event has ended.  Anxiety is caused by something internal, such as imagining a terrible outcome or worrying that something will go wrong that will devastate you. Anxiety often does not go away even after the triggering event is over, and it can become long-term (chronic) worry. It is important to understand the differences between stress and anxiety and to manage your  stress effectively so that it does not lead to an anxious response. Talk with your health care provider or a counselor to learn more about reducing anxiety and stress. He or she may suggest tension reduction techniques, such as:  Music therapy. This can include creating or listening to music that you enjoy and that inspires you.  Mindfulness-based meditation. This involves being aware of your normal breaths while not trying to control your breathing. It can be done while sitting or walking.  Centering prayer. This involves focusing on a word, phrase, or sacred image that means something to you and brings you peace.  Deep breathing. To do this, expand your stomach and inhale slowly through your nose. Hold your breath for 3-5 seconds. Then exhale slowly, letting your stomach muscles relax.  Self-talk. This involves identifying thought patterns that lead to anxiety reactions and changing those patterns.  Muscle relaxation. This involves tensing muscles and then relaxing them. Choose a tension reduction technique that suits your lifestyle and personality. These techniques take time and practice. Set aside 5-15 minutes a day to do them. Therapists can offer counseling and training in these techniques. The training to help with anxiety may be covered by some insurance plans. Other things you can do to manage stress and anxiety include:  Keeping a stress/anxiety diary. This can help you learn what triggers your reaction and then learn ways to manage your response.  Thinking about how you react to certain situations. You may not be able to control everything, but you can control your response.  Making time for activities that help you relax and not feeling guilty about spending your time in this way.  Visual imagery and yoga can help you stay calm and relax.   Medicines Medicines can help ease symptoms. Medicines for anxiety include:  Anti-anxiety drugs.  Antidepressants. Medicines are often  used as a primary treatment for anxiety disorder. Medicines will be prescribed by a health care provider. When used together, medicines, psychotherapy, and tension reduction techniques may be the most effective treatment. Relationships Relationships can play a big part in helping you recover. Try to spend more time connecting with trusted friends and family members. Consider going to couples counseling, taking family education classes, or going to family therapy. Therapy can help you and others better understand your condition. How to recognize changes in your anxiety Everyone responds differently to treatment for anxiety. Recovery from anxiety happens when symptoms decrease and stop interfering with your daily activities at home or work. This may mean that you will start to:  Have better concentration and focus. Worry will interfere less in your daily thinking.  Sleep better.  Be less irritable.  Have more energy.  Have improved memory. It is important to recognize when your condition is getting worse. Contact your health care provider if your symptoms interfere with home or work and you feel like your condition is not improving. Follow these instructions at home: Activity  Exercise. Most adults should do the following: ? Exercise for at least 150 minutes each week. The exercise should increase  rate and make you sweat (moderate-intensity exercise). ? Strengthening exercises at least twice a week.  Get the right amount and quality of sleep. Most adults need 7-9 hours of sleep each night. Lifestyle  Eat a healthy diet that includes plenty of vegetables, fruits, whole grains, low-fat dairy products, and lean protein. Do not eat a lot of foods that are high in solid fats, added sugars, or salt.  Make choices that simplify your life.  Do not use any products that contain nicotine or tobacco, such as cigarettes, e-cigarettes, and chewing tobacco. If you need help quitting, ask your health  care provider.  Avoid caffeine, alcohol, and certain over-the-counter cold medicines. These may make you feel worse. Ask your pharmacist which medicines to avoid.   General instructions  Take over-the-counter and prescription medicines only as told by your health care provider.  Keep all follow-up visits as told by your health care provider. This is important. Where to find support You can get help and support from these sources:  Self-help groups.  Online and community organizations.  A trusted spiritual leader.  Couples counseling.  Family education classes.  Family therapy. Where to find more information You may find that joining a support group helps you deal with your anxiety. The following sources can help you locate counselors or support groups near you:  Mental Health America: www.mentalhealthamerica.net  Anxiety and Depression Association of America (ADAA): www.adaa.org  National Alliance on Mental Illness (NAMI): www.nami.org Contact a health care provider if you:  Have a hard time staying focused or finishing daily tasks.  Spend many hours a day feeling worried about everyday life.  Become exhausted by worry.  Start to have headaches, feel tense, or have nausea.  Urinate more than normal.  Have diarrhea. Get help right away if you have:  A racing heart and shortness of breath.  Thoughts of hurting yourself or others. If you ever feel like you may hurt yourself or others, or have thoughts about taking your own life, get help right away. You can go to your nearest emergency department or call:  Your local emergency services (911 in the [place]  A suicide crisis helpline, such as the National Suicide Prevention Lifeline at 1-800-273-8255. This is open 24 hours a day. Summary  Taking steps to learn and use tension reduction techniques can help calm you and help prevent triggering an anxiety reaction.  When used together, medicines, psychotherapy, and  tension reduction techniques may be the most effective treatment.  Family, friends, and partners can play a big part in helping you recover from an anxiety disorder. This information is not intended to replace advice given to you by your health care provider. Make sure you discuss any questions you have with your health care provider. Document Revised: 08/06/2018 Document Reviewed: 08/06/2018 Elsevier Patient Education  2021 Elsevier Inc.  

## 2020-05-20 NOTE — Progress Notes (Signed)
Virtual Visit via Telephone Note  I connected with [name] on 05/20/20 at  4:30 PM EST by telephone and verified that I am speaking with the correct person using two identifiers.   I discussed the limitations, risks, security and privacy concerns of performing an evaluation and management service by telephone and the availability of in person appointments. I also discussed with the patient that there may be a patient responsible charge related to this service. The patient expressed understanding and agreed to proceed.  The patient was at home. I spoke with the patient from my office. The names of people involved in this encounter were: Dr. Jerene Pitch and Terrace Arabia.  History of Present Illness: Jessica Waters is following up today regarding postpartum depression anxiety.  She recently transition to Effexor.  She feels like this medication has been helping improve her depression.  She reports that the last month has been incredibly stressful for her family.  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  He had to have emergency surgery to evacuate hematoma from his brain.  He has been recovering at [facility], however his recovery process was complicated by a pneumonia.  Fortunately he is now off the ventilator and is becoming more aware.  There are plans for him to be transition to inpatient recovery for several more weeks.  She was able to visit him for the first time yesterday since visitor restrictions were lifted with Covid precautions..  She has improved PHQ 9 and GAD 7 scores, despite the added stressors of her father being in the hospital.  She is feeling less overwhelmed recently with work.    Observations/Objective:  Physical Exam could not be performed. Because of the COVID-19 outbreak this visit was performed over the phone and not in person.   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  We will follow up with patient in 3 to 6 months via telephone or in person.  Follow Up Instructions: 3-6 months.     I discussed the assessment and treatment plan with the patient. The patient was provided an opportunity to ask questions and all were answered. The patient agreed with the plan and demonstrated an understanding of the instructions.   The patient was advised to call back or seek an in-person evaluation if the symptoms worsen or if the condition fails to improve as anticipated.  I provided 20 minutes of non-face-to-face time during this encounter.  Adelene Idler MD [facility], [facility] 05/20/2020 5:10 PM

## 2021-09-26 LAB — OB RESULTS CONSOLE GBS: GBS: NEGATIVE

## 2021-09-26 LAB — OB RESULTS CONSOLE HEPATITIS B SURFACE ANTIGEN: Hepatitis B Surface Ag: NEGATIVE

## 2021-09-26 LAB — OB RESULTS CONSOLE HIV ANTIBODY (ROUTINE TESTING): HIV: NONREACTIVE

## 2021-09-26 LAB — OB RESULTS CONSOLE GC/CHLAMYDIA
Chlamydia: NEGATIVE
Neisseria Gonorrhea: NEGATIVE

## 2021-09-26 LAB — OB RESULTS CONSOLE RPR: RPR: NONREACTIVE

## 2021-09-26 NOTE — H&P (Signed)
Preoperative History and Physical  Jessica Waters is a 26 y.o. G2P1001 here for pre-operative visit and routine prenatal appointment.   No significant preoperative concerns.  History of Present Illness: 26 y.o. G74P1001 female at [redacted]w[redacted]d who presents for a pre-opertative visit for cesarean delivery.  She had a cesarean section on 09/23/2019 for arrest of descent.  She has been previously counseled about repeat cesarean delivery versus attempted VBAC.  She has elected cesarean delivery. Her prior delivery was complicated by postpartum hemorrhage and she did receive a blood transfusion.  That pregnancy was also complicated by preeclampsia without severe features.   She notes +FM, no LOF, no vaginal bleeding. She also denies contractions.    Proposed surgery: Repeat cesarean section  Past Medical History:  Diagnosis Date   Anxiety    Depression    History of blood transfusion    Past Surgical History:  Procedure Laterality Date   CESAREAN SECTION     TONSILLECTOMY & ADENOIDECTOMY     OB History  Gravida Para Term Preterm AB Living  2 1 1     1   SAB IAB Ectopic Molar Multiple Live Births            1    # Outcome Date GA Lbr Len/2nd Weight Sex Delivery Anes PTL Lv  2 Current           1 Term 09/23/19 [redacted]w[redacted]d / 06:54 3.53 kg (7 lb 12.5 oz) F CS-LTranv EPI  LIV  Patient denies any other pertinent gynecologic issues.   Current Outpatient Medications on File Prior to Visit  Medication Sig Dispense Refill   aspirin 81 MG EC tablet Take 81 mg by mouth once daily     prenatal vitamin with iron-folic acid (PRENATAL TABLETS) tablet Take 1 tablet by mouth once daily     sertraline (ZOLOFT) 100 MG tablet      No current facility-administered medications on file prior to visit.   Allergies  Allergen Reactions   Pork/Porcine Containing Products Swelling    Face swelling and severe cramps   Beef Containing Products Swelling    Social History:   reports that she has never smoked. She has never  used smokeless tobacco. She reports that she does not currently use alcohol. She reports that she does not use drugs.  Family History  Problem Relation Age of Onset   Lupus Mother     Review of Systems: Noncontributory  PHYSICAL EXAM: Blood pressure 117/81, pulse 101, height 144.8 cm (4' 9.01"), weight 62.1 kg (136 lb 12.8 oz), last menstrual period 01/21/2021. CONSTITUTIONAL: Well-developed, well-nourished female in no acute distress.  HENT:  Normocephalic, atraumatic, External right and left ear normal. Oropharynx is clear and moist EYES: Conjunctivae and EOM are normal. Pupils are equal, round, and reactive to light. No scleral icterus.  NECK: Normal range of motion, supple, no masses SKIN: Skin is warm and dry. No rash noted. Not diaphoretic. No erythema. No pallor. NEUROLGIC: Alert and oriented to person, place, and time. Normal reflexes, muscle tone coordination. No cranial nerve deficit noted. PSYCHIATRIC: Normal mood and affect. Normal behavior. Normal judgment and thought content. CARDIOVASCULAR: Normal heart rate noted, regular rhythm RESPIRATORY: Effort and breath sounds normal, no problems with respiration noted ABDOMEN: Soft, nontender, nondistended. PELVIC: NEFG MUSCULOSKELETAL: Normal range of motion. No edema and no tenderness. 2+ distal pulses.  FHR: 140 FH: 35 cm  Female chaperone present for pelvic and/or breast exam   Labs: No results found for this or any previous  visit (from the past 336 hour(s)).  Imaging Studies: No results found.  Assessment: Patient Active Problem List  Diagnosis   Supervision of other high risk pregnancies, third trimester   Anxiety   History of pre-eclampsia in prior pregnancy, currently pregnant   History of cesarean section   Allergy to alpha-gal  35 weeks  Plan: Patient will undergo surgical management with the above noted surgery.   The risks of surgery were discussed in detail with the patient including but not limited  to: bleeding which may require transfusion or reoperation; infection which may require antibiotics; injury to surrounding organs which may involve bowel, bladder, ureters ; need for additional procedures including laparoscopy or laparotomy; thromboembolic phenomenon, surgical site problems and other postoperative/anesthesia complications. Likelihood of success in alleviating the patient's condition was discussed. Routine postoperative instructions will be reviewed with the patient and her family in detail after surgery.  The patient concurred with the proposed plan, giving informed written consent for the surgery.  Preoperative prophylactic antibiotics, as indicated, and SCDs ordered on call to the OR.     Attestation Statement:   I personally performed the service. (TP)  Shyan Scalisi Teola Bradley, MD  [facility] 09/26/2021 9:43 AM

## 2021-10-14 ENCOUNTER — Encounter: Payer: Self-pay | Admitting: Obstetrics and Gynecology

## 2021-10-14 ENCOUNTER — Observation Stay
Admission: EM | Admit: 2021-10-14 | Discharge: 2021-10-14 | Disposition: A | Discharge status: Home / Self Care | Payer: BC Managed Care – PPO | Attending: Certified Nurse Midwife | Admitting: Certified Nurse Midwife

## 2021-10-14 ENCOUNTER — Other Ambulatory Visit: Payer: Self-pay

## 2021-10-14 DIAGNOSIS — O163 Unspecified maternal hypertension, third trimester: Principal | ICD-10-CM | POA: Insufficient documentation

## 2021-10-14 DIAGNOSIS — D509 Iron deficiency anemia, unspecified: Secondary | ICD-10-CM | POA: Diagnosis not present

## 2021-10-14 DIAGNOSIS — R519 Headache, unspecified: Secondary | ICD-10-CM | POA: Insufficient documentation

## 2021-10-14 DIAGNOSIS — Z3A38 38 weeks gestation of pregnancy: Secondary | ICD-10-CM | POA: Insufficient documentation

## 2021-10-14 DIAGNOSIS — O99891 Other specified diseases and conditions complicating pregnancy: Secondary | ICD-10-CM | POA: Diagnosis not present

## 2021-10-14 DIAGNOSIS — Z98891 History of uterine scar from previous surgery: Secondary | ICD-10-CM | POA: Insufficient documentation

## 2021-10-14 DIAGNOSIS — O26893 Other specified pregnancy related conditions, third trimester: Secondary | ICD-10-CM | POA: Diagnosis present

## 2021-10-14 DIAGNOSIS — O99013 Anemia complicating pregnancy, third trimester: Secondary | ICD-10-CM | POA: Insufficient documentation

## 2021-10-14 DIAGNOSIS — Z79899 Other long term (current) drug therapy: Secondary | ICD-10-CM | POA: Insufficient documentation

## 2021-10-14 DIAGNOSIS — Z7982 Long term (current) use of aspirin: Secondary | ICD-10-CM | POA: Insufficient documentation

## 2021-10-14 HISTORY — DX: Gestational (pregnancy-induced) hypertension without significant proteinuria, unspecified trimester: O13.9

## 2021-10-14 LAB — COMPREHENSIVE METABOLIC PANEL
ALT: 9 U/L (ref 0–44)
AST: 14 U/L — ABNORMAL LOW (ref 15–41)
Albumin: 2.8 g/dL — ABNORMAL LOW (ref 3.5–5.0)
Alkaline Phosphatase: 160 U/L — ABNORMAL HIGH (ref 38–126)
Anion gap: 7 (ref 5–15)
BUN: 10 mg/dL (ref 6–20)
CO2: 23 mmol/L (ref 22–32)
Calcium: 8.3 mg/dL — ABNORMAL LOW (ref 8.9–10.3)
Chloride: 108 mmol/L (ref 98–111)
Creatinine, Ser: 0.65 mg/dL (ref 0.44–1.00)
GFR, Estimated: >60 mL/min (ref >60)
Glucose, Bld: 82 mg/dL (ref 70–99)
Potassium: 3.8 mmol/L (ref 3.5–5.1)
Sodium: 138 mmol/L (ref 135–145)
Total Bilirubin: 0.4 mg/dL (ref 0.3–1.2)
Total Protein: 6.3 g/dL — ABNORMAL LOW (ref 6.5–8.1)

## 2021-10-14 LAB — CBC
HCT: 23.2 % — ABNORMAL LOW (ref 36.0–46.0)
Hemoglobin: 7.2 g/dL — ABNORMAL LOW (ref 12.0–15.0)
MCH: 22.6 pg — ABNORMAL LOW (ref 26.0–34.0)
MCHC: 31 g/dL (ref 30.0–36.0)
MCV: 73 fL — ABNORMAL LOW (ref 80.0–100.0)
Platelets: 324 10*3/uL (ref 150–400)
RBC: 3.18 MIL/uL — ABNORMAL LOW (ref 3.87–5.11)
RDW: 15.4 % (ref 11.5–15.5)
WBC: 11.6 10*3/uL — ABNORMAL HIGH (ref 4.0–10.5)
nRBC: 0.3 % — ABNORMAL HIGH (ref 0.0–0.2)

## 2021-10-14 LAB — PROTEIN / CREATININE RATIO, URINE
Creatinine, Urine: 179 mg/dL
Protein Creatinine Ratio: 0.19 mg/mg{creat} — ABNORMAL HIGH (ref 0.00–0.15)
Total Protein, Urine: 34 mg/dL

## 2021-10-14 MED ORDER — SODIUM CHLORIDE 0.9 % IV SOLN
300.0000 mg | INTRAVENOUS | Status: DC
  Administered 2021-10-14: 300 mg via INTRAVENOUS
  Filled 2021-10-14: qty 300

## 2021-10-14 MED ORDER — LACTATED RINGERS IV SOLN: INTRAVENOUS | Status: DC

## 2021-10-14 MED ORDER — MAGNESIUM OXIDE -MG SUPPLEMENT 400 (240 MG) MG PO TABS
400.0000 mg | ORAL_TABLET | Freq: Two times a day (BID) | ORAL | Status: DC
  Administered 2021-10-14: 400 mg via ORAL
  Filled 2021-10-14 (×2): qty 1

## 2021-10-14 MED ORDER — CALCIUM CARBONATE ANTACID 500 MG PO CHEW: 2.0000 | CHEWABLE_TABLET | ORAL | Status: DC | PRN

## 2021-10-14 MED ORDER — LACTATED RINGERS IV BOLUS
500.0000 mL | Freq: Once | INTRAVENOUS | Status: AC
  Administered 2021-10-14: 500 mL via INTRAVENOUS

## 2021-10-14 MED ORDER — ACETAMINOPHEN 325 MG PO TABS
650.0000 mg | ORAL_TABLET | ORAL | Status: DC | PRN
  Administered 2021-10-14: 650 mg via ORAL
  Filled 2021-10-14: qty 2

## 2021-10-14 NOTE — OB Triage Note (Signed)
Pt presents with co HA and elevated blood pressure, from work. Denies epigastric pain and visual disturbances. + fetal movement audible. Reports not having taken any medication for HA. Elaina Hoops

## 2021-10-14 NOTE — OB Triage Note (Signed)
Patient discharged home per order.  She is stable and ambulatory. An After Visit Summary was printed and given to the patient. Discharge education completed with patient and support person including follow up instructions, appointments, and medication list. She received labor and bleeding precautions. Patient able to verbalize understanding. All questions fully answered upon discharge. Patient instructed to return to ED, call 911, or call provider for any changes in condition. Patient discharged home via personal vehicle with support person and all belongings.    

## 2021-10-14 NOTE — Discharge Summary (Cosign Needed Addendum)
Jessica Waters is a 26 y.o. female. She is at [redacted]w[redacted]d gestation. Patient's last menstrual period was 01/21/2021. Estimated Date of Delivery: 10/28/21  Prenatal care site: [facility]   Current pregnancy complicated by:  - Previous cesarean section x 1 - h/o pre-eclampsia with G1 - Anemia - Anxiety/depression  Chief complaint: pre-eclampsia symptoms, headache, worried about elevated home BP of 130s-140s/80s. Had pre-eclampsia with G1 so concerned she is developing it again.  S: Resting comfortably. no CTX, no VB.no LOF,  Active fetal movement. Reports headache. Denies: visual changes, SOB, or RUQ/epigastric pain  Maternal Medical History:   Past Medical History:  Diagnosis Date   Allergy to alpha-gal    Anxiety    Headache    Migraines   Pregnancy induced hypertension     Past Surgical History:  Procedure Laterality Date   CESAREAN SECTION N/A 09/23/2019   Procedure: CESAREAN SECTION;  Surgeon: Natale Milch, MD;  Location: [facility] ORS;  Service: Obstetrics;  Laterality: N/A;   TONSILLECTOMY      Allergies  Allergen Reactions   Pork-Derived Products Swelling    Face swelling and severe cramps   Beef-Derived Products     Prior to Admission medications   Medication Sig Start Date End Date Taking? Authorizing Provider  aspirin EC 81 MG tablet Take 81 mg by mouth daily. Swallow whole.   Yes [provider]  Prenatal Vit-Fe Fumarate-FA (MULTIVITAMIN-PRENATAL) 27-0.8 MG TABS tablet Take 1 tablet by mouth daily at 12 noon.   Yes [provider]  sertraline (ZOLOFT) 50 MG tablet Take 50 mg by mouth daily.   Yes [provider]  clonazePAM (KLONOPIN) 0.5 MG tablet Take 1 tablet (0.5 mg total) by mouth 2 (two) times daily. 11/03/19 11/02/20  Natale Milch, MD  labetalol (NORMODYNE) 100 MG tablet Take 2 tablets (200 mg total) by mouth 2 (two) times daily. Patient not taking: Reported on 03/25/2020 09/26/19   Tresea Mall, CNM  OVER THE  COUNTER MEDICATION Take 1 capsule by mouth daily. Nature made prenatal vitamin    [provider]  venlafaxine XR (EFFEXOR-XR) 75 MG 24 hr capsule Take 2 capsules (150 mg total) by mouth daily. Patient not taking: Reported on 10/14/2021 03/25/20   Natale Milch, MD      Social History: She  reports that she has never smoked. She has never used smokeless tobacco. She reports that she does not currently use alcohol. She reports that she does not use drugs.  Family History: family history includes Fibromyalgia in her paternal aunt; Lupus in her mother; Rheum arthritis in her maternal aunt.  no history of gyn cancers  Review of Systems: A full review of systems was performed and negative except as noted in the HPI.    O:  BP 127/82   Pulse 84   Temp 98.9 F (37.2 C) (Oral)   Resp 18   Ht 5' (1.524 m)   Wt 63.5 kg   LMP 01/21/2021   BMI 27.34 kg/m  Vitals:   10/14/21 1421 10/14/21 1432 10/14/21 1447 10/14/21 1502  BP: 125/79 123/74 117/71 115/70   10/14/21 1517 10/14/21 1532 10/14/21 1547 10/14/21 1602  BP: 114/70 112/64 112/61 111/69   10/14/21 1617 10/14/21 1906  BP: 109/65 127/82    Results for orders placed or performed during the hospital encounter of 10/14/21 (from the past 48 hour(s))  Comprehensive metabolic panel   Collection Time: 10/14/21  3:08 PM  Result Value Ref Range   Sodium  138 135 - 145 mmol/L   Potassium 3.8 3.5 - 5.1 mmol/L   Chloride 108 98 - 111 mmol/L   CO2 23 22 - 32 mmol/L   Glucose, Bld 82 70 - 99 mg/dL   BUN 10 6 - 20 mg/dL   Creatinine, Ser 2.44 0.44 - 1.00 mg/dL   Calcium 8.3 (L) 8.9 - 10.3 mg/dL   Total Protein 6.3 (L) 6.5 - 8.1 g/dL   Albumin 2.8 (L) 3.5 - 5.0 g/dL   AST 14 (L) 15 - 41 U/L   ALT 9 0 - 44 U/L   Alkaline Phosphatase 160 (H) 38 - 126 U/L   Total Bilirubin 0.4 0.3 - 1.2 mg/dL   GFR, Estimated >01 >02 mL/min   Anion gap 7 5 - 15  CBC   Collection Time: 10/14/21  3:08 PM  Result Value Ref Range   WBC 11.6 (H)  4.0 - 10.5 K/uL   RBC 3.18 (L) 3.87 - 5.11 MIL/uL   Hemoglobin 7.2 (L) 12.0 - 15.0 g/dL   HCT 72.5 (L) 36.6 - 44.0 %   MCV 73.0 (L) 80.0 - 100.0 fL   MCH 22.6 (L) 26.0 - 34.0 pg   MCHC 31.0 30.0 - 36.0 g/dL   RDW 34.7 42.5 - 95.6 %   Platelets 324 150 - 400 K/uL   nRBC 0.3 (H) 0.0 - 0.2 %  Protein / creatinine ratio, urine   Collection Time: 10/14/21  3:14 PM  Result Value Ref Range   Creatinine, Urine 179 mg/dL   Total Protein, Urine 34 mg/dL   Protein Creatinine Ratio 0.19 (H) 0.00 - 0.15 mg/mg[Cre]     Constitutional: NAD, AAOx3  HE/ENT: extraocular movements grossly intact, moist mucous membranes CV: RRR PULM: nl respiratory effort, CTABL     Abd: gravid, non-tender, non-distended, soft      Ext: Non-tender, Nonedematous   Psych: mood appropriate, speech normal Pelvic: deferred  Fetal  monitoring: Cat 1 Appropriate for GA Baseline: 120bpm Variability: moderate Accelerations: present x >2 Decelerations absent Contractions: occasional  A/P: 26 y.o. [redacted]w[redacted]d here for antenatal surveillance for PIH work-up  Principle Diagnosis:  Normal pregnancy in third trimester  Pre-eclampsia: not present. AST/ALT WNL, platelets, WNL, P/C ratio 0.19 Iron deficiency anemia: Hgb 7.2. Administered IV Venofer 300mg  and will repeat in 1 week.  Headache: Relieved with Tylenol 1000mg  and Magnesium 400mg  PO Labor: not present.  Fetal Wellbeing: Reassuring Cat 1 tracing. Reactive NST  D/c home stable, precautions reviewed, follow-up as scheduled.    , CNM 10/14/2021 9:56 PM

## 2021-10-17 ENCOUNTER — Other Ambulatory Visit: Payer: Self-pay

## 2021-10-17 ENCOUNTER — Encounter
Admission: RE | Admit: 2021-10-17 | Discharge: 2021-10-17 | Disposition: A | Discharge status: Home / Self Care | Payer: BC Managed Care – PPO | Source: Ambulatory Visit | Attending: Obstetrics and Gynecology | Admitting: Obstetrics and Gynecology

## 2021-10-17 DIAGNOSIS — Z01812 Encounter for preprocedural laboratory examination: Secondary | ICD-10-CM

## 2021-10-17 HISTORY — DX: Pneumonia, unspecified organism: J18.9

## 2021-10-17 HISTORY — DX: Anemia, unspecified: D64.9

## 2021-10-17 NOTE — Patient Instructions (Addendum)
Your procedure is scheduled on: 10/24/21 - Monday  You will report to our Emergency Department at 05:45 am , and you will be assisted to the 3 rd floor.  REMEMBER: Instructions that are not followed completely may result in serious medical risk, up to and including death; or upon the discretion of your surgeon and anesthesiologist your surgery may need to be rescheduled.  Do not eat food or drink any fluids after midnight the night before surgery.  No gum chewing, lozengers or hard candies.  TAKE THESE MEDICATIONS THE MORNING OF SURGERY WITH A SIP OF WATER:  - MULTIVITAMIN-PRENATAL - sertraline (ZOLOFT)   One week prior to surgery: Stop Anti-inflammatories (NSAIDS) such as Advil, Aleve, Ibuprofen, Motrin, Naproxen, Naprosyn and Aspirin based products such as Excedrin, Goodys Powder, BC Powder.  Stop ANY OVER THE COUNTER supplements until after surgery.  You may take Tylenol if needed for pain up until the day of surgery.  No Alcohol for 24 hours before or after surgery.  No Smoking including e-cigarettes for 24 hours prior to surgery.  No chewable tobacco products for at least 6 hours prior to surgery.  No nicotine patches on the day of surgery.  Do not use any "recreational" drugs for at least a week prior to your surgery.  Please be advised that the combination of cocaine and anesthesia may have negative outcomes, up to and including death. If you test positive for cocaine, your surgery will be cancelled.  On the morning of surgery brush your teeth with toothpaste and water, you may rinse your mouth with mouthwash if you wish. Do not swallow any toothpaste or mouthwash.  Use CHG Soap or wipes as directed on instruction sheet.  Do not wear jewelry, make-up, hairpins, clips or nail polish.  Do not wear lotions, powders, or perfumes.   Do not shave body from the neck down 48 hours prior to surgery just in case you cut yourself which could leave a site for infection.  Also,  freshly shaved skin may become irritated if using the CHG soap.  Contact lenses, hearing aids and dentures may not be worn into surgery.  Do not bring valuables to the hospital. [facility] is not responsible for any missing/lost belongings or valuables.   Notify your doctor if there is any change in your medical condition (cold, fever, infection).  Wear comfortable clothing (specific to your surgery type) to the hospital.  After surgery, you can help prevent lung complications by doing breathing exercises.  Take deep breaths and cough every 1-2 hours. Your doctor may order a device called an Incentive Spirometer to help you take deep breaths. When coughing or sneezing, hold a pillow firmly against your incision with both hands. This is called "splinting." Doing this helps protect your incision. It also decreases belly discomfort.  If you are being admitted to the hospital overnight, leave your suitcase in the car. After surgery it may be brought to your room.  If you are being discharged the day of surgery, you will not be allowed to drive home. You will need a responsible adult (18 years or older) to drive you home and stay with you that night.   If you are taking public transportation, you will need to have a responsible adult (18 years or older) with you. Please confirm with your physician that it is acceptable to use public transportation.   Please call the Pre-admissions Testing Dept. at [phone] if you have any questions about these instructions.  Surgery  Visitation Policy:  Patients undergoing a surgery or procedure may have two family members or support persons with them as long as the person is not COVID-19 positive or experiencing its symptoms.   Labor and Delivery:  You are allowed 1 support person in the OR with you and 1 support person to stay at night with.  Inpatient Visitation:    Visiting hours are 7 a.m. to 8 p.m. Up to four visitors are allowed at one  time in a patient room, including children. The visitors may rotate out with other people during the day. One designated support person (adult) may remain overnight.

## 2021-10-20 ENCOUNTER — Inpatient Hospital Stay: Payer: BC Managed Care – PPO | Admitting: Anesthesiology

## 2021-10-20 ENCOUNTER — Inpatient Hospital Stay
Admission: EM | Admit: 2021-10-20 | Discharge: 2021-10-22 | DRG: 788 | Disposition: A | Discharge status: Home / Self Care | Payer: BC Managed Care – PPO | Attending: Obstetrics | Admitting: Obstetrics

## 2021-10-20 ENCOUNTER — Encounter
Admission: EM | Disposition: A | Discharge status: Home / Self Care | Payer: Self-pay | Source: Home / Self Care | Attending: Obstetrics

## 2021-10-20 ENCOUNTER — Encounter: Payer: Self-pay | Admitting: Obstetrics and Gynecology

## 2021-10-20 ENCOUNTER — Other Ambulatory Visit: Payer: Self-pay

## 2021-10-20 ENCOUNTER — Inpatient Hospital Stay: Payer: BC Managed Care – PPO | Admitting: Urgent Care

## 2021-10-20 DIAGNOSIS — D509 Iron deficiency anemia, unspecified: Secondary | ICD-10-CM | POA: Diagnosis present

## 2021-10-20 DIAGNOSIS — O34219 Maternal care for unspecified type scar from previous cesarean delivery: Secondary | ICD-10-CM

## 2021-10-20 DIAGNOSIS — O34211 Maternal care for low transverse scar from previous cesarean delivery: Principal | ICD-10-CM | POA: Diagnosis present

## 2021-10-20 DIAGNOSIS — Z3A38 38 weeks gestation of pregnancy: Secondary | ICD-10-CM | POA: Diagnosis not present

## 2021-10-20 DIAGNOSIS — Z98891 History of uterine scar from previous surgery: Principal | ICD-10-CM

## 2021-10-20 DIAGNOSIS — O9279 Other disorders of lactation: Secondary | ICD-10-CM | POA: Diagnosis present

## 2021-10-20 DIAGNOSIS — O26893 Other specified pregnancy related conditions, third trimester: Secondary | ICD-10-CM | POA: Diagnosis present

## 2021-10-20 DIAGNOSIS — O9902 Anemia complicating childbirth: Secondary | ICD-10-CM | POA: Diagnosis present

## 2021-10-20 DIAGNOSIS — Z01812 Encounter for preprocedural laboratory examination: Secondary | ICD-10-CM

## 2021-10-20 LAB — CBC
HCT: 26.2 % — ABNORMAL LOW (ref 36.0–46.0)
Hemoglobin: 8.2 g/dL — ABNORMAL LOW (ref 12.0–15.0)
MCH: 24 pg — ABNORMAL LOW (ref 26.0–34.0)
MCHC: 31.3 g/dL (ref 30.0–36.0)
MCV: 76.6 fL — ABNORMAL LOW (ref 80.0–100.0)
Platelets: 307 10*3/uL (ref 150–400)
RBC: 3.42 MIL/uL — ABNORMAL LOW (ref 3.87–5.11)
RDW: 18.3 % — ABNORMAL HIGH (ref 11.5–15.5)
WBC: 13.7 10*3/uL — ABNORMAL HIGH (ref 4.0–10.5)
nRBC: 0.7 % — ABNORMAL HIGH (ref 0.0–0.2)

## 2021-10-20 LAB — TYPE AND SCREEN
ABO/RH(D): O POS
Antibody Screen: NEGATIVE

## 2021-10-20 LAB — RAPID HIV SCREEN (HIV 1/2 AB+AG)
HIV 1/2 Antibodies: NONREACTIVE
HIV-1 P24 Antigen - HIV24: NONREACTIVE

## 2021-10-20 SURGERY — Surgical Case
Anesthesia: Spinal

## 2021-10-20 MED ORDER — PHENYLEPHRINE HCL-NACL 20-0.9 MG/250ML-% IV SOLN
INTRAVENOUS | Status: DC | PRN
  Administered 2021-10-20: 50 ug/min via INTRAVENOUS

## 2021-10-20 MED ORDER — TRANEXAMIC ACID-NACL 1000-0.7 MG/100ML-% IV SOLN
INTRAVENOUS | Status: AC
  Filled 2021-10-20: qty 100

## 2021-10-20 MED ORDER — BUPIVACAINE 0.25 % ON-Q PUMP DUAL CATH 400 ML
400.0000 mL | INJECTION | Status: DC
  Filled 2021-10-20: qty 400

## 2021-10-20 MED ORDER — OXYTOCIN-SODIUM CHLORIDE 30-0.9 UT/500ML-% IV SOLN
INTRAVENOUS | Status: DC | PRN
  Administered 2021-10-20: 30 [IU] via INTRAVENOUS

## 2021-10-20 MED ORDER — HYDROXYZINE HCL 25 MG PO TABS
25.0000 mg | ORAL_TABLET | Freq: Once | ORAL | Status: AC
  Administered 2021-10-20: 25 mg via ORAL
  Filled 2021-10-20: qty 1

## 2021-10-20 MED ORDER — FENTANYL CITRATE (PF) 100 MCG/2ML IJ SOLN
INTRAMUSCULAR | Status: DC | PRN
  Administered 2021-10-20: 15 ug via INTRATHECAL

## 2021-10-20 MED ORDER — ONDANSETRON HCL 4 MG/2ML IJ SOLN
INTRAMUSCULAR | Status: DC | PRN
  Administered 2021-10-20: 4 mg via INTRAVENOUS

## 2021-10-20 MED ORDER — BUPIVACAINE HCL (PF) 0.5 % IJ SOLN: 5.0000 mL | Freq: Once | INTRAMUSCULAR | Status: DC

## 2021-10-20 MED ORDER — CHLORHEXIDINE GLUCONATE 0.12 % MT SOLN
15.0000 mL | Freq: Once | OROMUCOSAL | Status: AC
  Administered 2021-10-20: 15 mL via OROMUCOSAL

## 2021-10-20 MED ORDER — BUPIVACAINE HCL (PF) 0.5 % IJ SOLN
INTRAMUSCULAR | Status: DC | PRN
  Administered 2021-10-20: 60 mL

## 2021-10-20 MED ORDER — DEXAMETHASONE SODIUM PHOSPHATE 10 MG/ML IJ SOLN
INTRAMUSCULAR | Status: DC | PRN
  Administered 2021-10-20: 10 mg via INTRAVENOUS

## 2021-10-20 MED ORDER — METHYLERGONOVINE MALEATE 0.2 MG/ML IJ SOLN
INTRAMUSCULAR | Status: AC
  Filled 2021-10-20: qty 1

## 2021-10-20 MED ORDER — CARBOPROST TROMETHAMINE 250 MCG/ML IM SOLN
INTRAMUSCULAR | Status: AC
  Filled 2021-10-20: qty 1

## 2021-10-20 MED ORDER — MORPHINE SULFATE (PF) 0.5 MG/ML IJ SOLN
INTRAMUSCULAR | Status: AC
  Filled 2021-10-20: qty 10

## 2021-10-20 MED ORDER — ONDANSETRON HCL 4 MG/2ML IJ SOLN
INTRAMUSCULAR | Status: AC
  Filled 2021-10-20: qty 2

## 2021-10-20 MED ORDER — CEFAZOLIN SODIUM-DEXTROSE 2-4 GM/100ML-% IV SOLN
2.0000 g | INTRAVENOUS | Status: AC
  Administered 2021-10-20: 2 g via INTRAVENOUS
  Filled 2021-10-20: qty 100

## 2021-10-20 MED ORDER — DEXAMETHASONE SODIUM PHOSPHATE 10 MG/ML IJ SOLN
INTRAMUSCULAR | Status: AC
  Filled 2021-10-20: qty 1

## 2021-10-20 MED ORDER — SOD CITRATE-CITRIC ACID 500-334 MG/5ML PO SOLN
30.0000 mL | ORAL | Status: AC
  Administered 2021-10-20: 30 mL via ORAL

## 2021-10-20 MED ORDER — FENTANYL CITRATE (PF) 100 MCG/2ML IJ SOLN
INTRAMUSCULAR | Status: AC
  Filled 2021-10-20: qty 2

## 2021-10-20 MED ORDER — 0.9 % SODIUM CHLORIDE (POUR BTL) OPTIME
TOPICAL | Status: DC | PRN
  Administered 2021-10-20: 1000 mL

## 2021-10-20 MED ORDER — MORPHINE SULFATE (PF) 0.5 MG/ML IJ SOLN
INTRAMUSCULAR | Status: DC | PRN
  Administered 2021-10-20: .1 mg via INTRATHECAL

## 2021-10-20 MED ORDER — LACTATED RINGERS IV SOLN: Freq: Once | INTRAVENOUS | Status: AC

## 2021-10-20 MED ORDER — LACTATED RINGERS IV SOLN: INTRAVENOUS | Status: DC | PRN

## 2021-10-20 MED ORDER — ORAL CARE MOUTH RINSE: 15.0000 mL | Freq: Once | OROMUCOSAL | Status: AC

## 2021-10-20 MED ORDER — PHENYLEPHRINE HCL (PRESSORS) 10 MG/ML IV SOLN
INTRAVENOUS | Status: DC | PRN
  Administered 2021-10-20 (×3): 80 ug via INTRAVENOUS

## 2021-10-20 MED ORDER — AZITHROMYCIN 500 MG IV SOLR: 500.0000 mg | INTRAVENOUS | Status: DC

## 2021-10-20 MED ORDER — BUPIVACAINE IN DEXTROSE 0.75-8.25 % IT SOLN
INTRATHECAL | Status: DC | PRN
  Administered 2021-10-20: 1.4 mL via INTRATHECAL

## 2021-10-20 SURGICAL SUPPLY — 33 items
CATH KIT ON-Q SILVERSOAK 5 (CATHETERS) ×2 IMPLANT
CATH KIT ON-Q SILVERSOAK 5IN (CATHETERS) ×4 IMPLANT
DERMABOND ADVANCED (GAUZE/BANDAGES/DRESSINGS) ×1
DERMABOND ADVANCED .7 DNX12 (GAUZE/BANDAGES/DRESSINGS) ×1 IMPLANT
DRSG OPSITE POSTOP 4X10 (GAUZE/BANDAGES/DRESSINGS) ×2 IMPLANT
DRSG TELFA 3X8 NADH (GAUZE/BANDAGES/DRESSINGS) ×2 IMPLANT
ELECT CAUTERY BLADE 6.4 (BLADE) ×2 IMPLANT
ELECT REM PT RETURN 9FT ADLT (ELECTROSURGICAL) ×2
ELECTRODE REM PT RTRN 9FT ADLT (ELECTROSURGICAL) ×1 IMPLANT
GAUZE SPONGE 4X4 12PLY STRL (GAUZE/BANDAGES/DRESSINGS) ×2 IMPLANT
GLOVE BIO SURGEON STRL SZ7 (GLOVE) ×2 IMPLANT
GLOVE SURG UNDER LTX SZ7.5 (GLOVE) ×2 IMPLANT
GOWN STRL REUS W/ TWL LRG LVL3 (GOWN DISPOSABLE) ×3 IMPLANT
GOWN STRL REUS W/TWL LRG LVL3 (GOWN DISPOSABLE) ×3
MANIFOLD NEPTUNE II (INSTRUMENTS) ×2 IMPLANT
MAT PREVALON FULL STRYKER (MISCELLANEOUS) ×2 IMPLANT
NS IRRIG 1000ML POUR BTL (IV SOLUTION) ×2 IMPLANT
PACK C SECTION AR (MISCELLANEOUS) ×2 IMPLANT
PAD DRESSING TELFA 3X8 NADH (GAUZE/BANDAGES/DRESSINGS) ×1 IMPLANT
PAD OB MATERNITY 4.3X12.25 (PERSONAL CARE ITEMS) ×4 IMPLANT
PAD PREP 24X41 OB/GYN DISP (PERSONAL CARE ITEMS) ×2 IMPLANT
SCRUB CHG 4% DYNA-HEX 4OZ (MISCELLANEOUS) ×2 IMPLANT
STAPLER INSORB 30 2030 C-SECTI (MISCELLANEOUS) ×1 IMPLANT
STRIP CLOSURE SKIN 1/2X4 (GAUZE/BANDAGES/DRESSINGS) ×2 IMPLANT
SUT MNCRL 4-0 (SUTURE) ×1
SUT MNCRL 4-0 27XMFL (SUTURE) ×1
SUT PDS AB 1 TP1 96 (SUTURE) ×2 IMPLANT
SUT PLAIN GUT 0 (SUTURE) IMPLANT
SUT VIC AB 0 CTX 36 (SUTURE) ×2
SUT VIC AB 0 CTX36XBRD ANBCTRL (SUTURE) ×2 IMPLANT
SUTURE MNCRL 4-0 27XMF (SUTURE) ×1 IMPLANT
SWABSTK COMLB BENZOIN TINCTURE (MISCELLANEOUS) ×2 IMPLANT
WATER STERILE IRR 500ML POUR (IV SOLUTION) ×2 IMPLANT

## 2021-10-20 NOTE — Anesthesia Procedure Notes (Signed)
Spinal  Patient location during procedure: OR Reason for block: surgical anesthesia Staffing Performed: resident/CRNA  Anesthesiologist: Piscitello, Precious Haws, MD Resident/CRNA: Rolla Plate, CRNA Performed by: Rolla Plate, CRNA Authorized by: Andria Frames, MD   Preanesthetic Checklist Completed: patient identified, IV checked, site marked, risks and benefits discussed, surgical consent, monitors and equipment checked, pre-op evaluation and timeout performed Spinal Block Patient position: sitting Prep: ChloraPrep and site prepped and draped Patient monitoring: heart rate, continuous pulse ox, blood pressure and cardiac monitor Approach: midline Location: L4-5 Injection technique: single-shot Needle Needle type: Whitacre and Introducer  Needle gauge: 24 G Needle length: 9 cm Assessment Sensory level: T3 Events: CSF return Additional Notes Negative paresthesia. Negative blood return. Positive free-flowing CSF. Expiration date of kit checked and confirmed. Patient tolerated procedure well, without complications.

## 2021-10-20 NOTE — H&P (Signed)
OB History & Physical   History of Present Illness:  Chief Complaint:   HPI:  MELORA MENON is a 26 y.o. G9P1001 female at [redacted]w[redacted]d dated by LMP, c/w 11wUS.  She presents to L&D for uterine contractions  Contractions started around 1300 and have become steadily stronger and closer together.   She had a c-section in 2021 for failure to progress, desires repeat c-section. Is scheduled for repeat c-section on 8/7.  Pregnancy Issues: 1. Anxiety 2. Depression 3. Hx of blood transfusion 4. Prior c-section   Maternal Medical History:   Past Medical History:  Diagnosis Date   Allergy to alpha-gal    Anemia    Anxiety    Headache    Migraines   Pneumonia    Pregnancy induced hypertension     Past Surgical History:  Procedure Laterality Date   CESAREAN SECTION N/A 09/23/2019   Procedure: CESAREAN SECTION;  Surgeon: Natale Milch, MD;  Location: [facility] ORS;  Service: Obstetrics;  Laterality: N/A;   TONSILLECTOMY      Allergies  Allergen Reactions   Pork-Derived Products Swelling    Face swelling and severe cramps   Beef-Derived Products     Prior to Admission medications   Medication Sig Start Date End Date Taking? Authorizing Provider  Prenatal Vit-Fe Fumarate-FA (MULTIVITAMIN-PRENATAL) 27-0.8 MG TABS tablet Take 1 tablet by mouth daily at 12 noon.   Yes [provider]  sertraline (ZOLOFT) 50 MG tablet Take 50 mg by mouth daily.   Yes [provider]  acetaminophen (TYLENOL) 500 MG tablet Take 1,000 mg by mouth every 6 (six) hours as needed.    [provider]  aspirin EC 81 MG tablet Take 81 mg by mouth daily. Swallow whole. Patient not taking: Reported on 10/20/2021    [provider]     Prenatal care site: [facility]   Social History: She  reports that she has never smoked. She has never used smokeless tobacco. She reports that she does not currently use alcohol. She reports that she does not use drugs.  Family  History: family history includes Fibromyalgia in her paternal aunt; Lupus in her mother; Rheum arthritis in her maternal aunt.   Review of Systems: A full review of systems was performed and negative except as noted in the HPI.     Physical Exam:  Vital Signs: BP 129/87 (BP Location: Left Arm)   Pulse 97   Temp 98 F (36.7 C) (Oral)   Resp 17   Ht 5' (1.524 m)   Wt 64.4 kg   LMP 01/21/2021   BMI 27.73 kg/m  General: no acute distress.  HEENT: normocephalic, atraumatic Heart: regular rate & rhythm.  No murmurs/rubs/gallops Lungs: clear to auscultation bilaterally, normal respiratory effort Abdomen: soft, gravid, non-tender;  EFW: 6.5lb Pelvic:   External: Normal external female genitalia  Cervix:  4/80/-2   Extremities: non-tender, symmetric, mild edema bilaterally.  DTRs: +2  Neurologic: Alert & oriented x 3.    No results found for this or any previous visit (from the past 24 hour(s)).  Pertinent Results:  Prenatal Labs: Blood type/Rh O pos  Antibody screen neg  Rubella Immune (2021)  Varicella Immune (2021)  RPR NR  HBsAg Neg  HIV NR  GC neg  Chlamydia neg  Genetic screening negative  1 hour GTT 72  3 hour GTT   GBS neg   FHT: 135bpm, moderate variability, accels present, no decels TOCO: contractions q2-60min SVE:  4/80/-1  Cephalic by leopolds, c/w SVE  No results found.  Assessment:  Rylah A Burich is a 26 y.o. G2P1001 female at [redacted]w[redacted]d with uterine contractions.   Plan:  1. Admit to Labor & Delivery; consents reviewed and obtained - Notified Dr. Dalbert Garnet of need for repeat c-section  2. Fetal Well being  - Fetal Tracing: Cat I tracing - Group B Streptococcus ppx indicated: n/a, GBS negative - Presentation: vertex confirmed by SVE   3. Routine OB: - Prenatal labs reviewed, as above - Rh pos - CBC, T&S, RPR on admit - Clear fluids, IVF  4. Repeat cesarean section -  Contractions q2-74min, external toco in place -  Notified Dr. Dalbert Garnet that  patient is laboring and desires a repeat cesarean section - pre-op orders placed - patient last ate at 1830  - will proceed with repeat cesarean section  5. Post Partum Planning: - Infant feeding: breastfeeding - Contraception: condoms, NFP  Janyce Llanos, [place] 10/20/21 9:25 PM

## 2021-10-20 NOTE — Anesthesia Preprocedure Evaluation (Signed)
Anesthesia Evaluation  Patient identified by MRN, date of birth, ID band Patient awake    Reviewed: Allergy & Precautions, NPO status , Patient's Chart, lab work & pertinent test results  History of Anesthesia Complications Negative for: history of anesthetic complications  Airway Mallampati: III  TM Distance: >3 FB Neck ROM: full    Dental  (+) Chipped   Pulmonary neg pulmonary ROS, neg shortness of breath,    Pulmonary exam normal        Cardiovascular Exercise Tolerance: Good hypertension, (-) angina(-) Past MI Normal cardiovascular exam     Neuro/Psych  Headaches, Anxiety    GI/Hepatic negative GI ROS, neg GERD  ,  Endo/Other    Renal/GU   negative genitourinary   Musculoskeletal   Abdominal   Peds  Hematology negative hematology ROS (+)   Anesthesia Other Findings Past Medical History: No date: Allergy to alpha-gal No date: Anemia No date: Anxiety No date: Headache     Comment:  Migraines No date: Pneumonia No date: Pregnancy induced hypertension  Past Surgical History: 09/23/2019: CESAREAN SECTION; N/A     Comment:  Procedure: CESAREAN SECTION;  Surgeon: Natale Milch, MD;  Location: [facility] ORS;  Service:               Obstetrics;  Laterality: N/A; No date: TONSILLECTOMY  BMI    Body Mass Index: 27.73 kg/m      Reproductive/Obstetrics (+) Pregnancy                             Anesthesia Physical Anesthesia Plan  ASA: 2 and emergent  Anesthesia Plan: Spinal   Post-op Pain Management:    Induction:   PONV Risk Score and Plan:   Airway Management Planned: Natural Airway and Nasal Cannula  Additional Equipment:   Intra-op Plan:   Post-operative Plan:   Informed Consent: I have reviewed the patients History and Physical, chart, labs and discussed the procedure including the risks, benefits and alternatives for the proposed anesthesia with  the patient or authorized representative who has indicated his/her understanding and acceptance.     Dental Advisory Given  Plan Discussed with: Anesthesiologist, CRNA and Surgeon  Anesthesia Plan Comments: (Patient reports no bleeding problems and no anticoagulant use.  Plan for spinal with backup GA  Patient consented for risks of anesthesia including but not limited to:  - adverse reactions to medications - damage to eyes, teeth, lips or other oral mucosa - nerve damage due to positioning  - risk of bleeding, infection and or nerve damage from spinal that could lead to paralysis - risk of headache or failed spinal - damage to teeth, lips or other oral mucosa - sore throat or hoarseness - damage to heart, brain, nerves, lungs, other parts of body or loss of life  Patient voiced understanding.)        Anesthesia Quick Evaluation

## 2021-10-20 NOTE — OB Triage Note (Signed)
Pt is a 25y/o G2P1 at [redacted]w[redacted]d with c/o contractions throughout today about 1-2pm and have now gotten to be 6-1min apart and more intense. Pt states +FM. Pt denies LOF and VB. Monitors applied and assessing. Initial FHT 135.

## 2021-10-21 ENCOUNTER — Encounter: Payer: Self-pay | Admitting: Obstetrics and Gynecology

## 2021-10-21 ENCOUNTER — Ambulatory Visit: Admit: 2021-10-21 | Payer: BC Managed Care – PPO

## 2021-10-21 ENCOUNTER — Inpatient Hospital Stay: Admission: RE | Admit: 2021-10-21 | Payer: BC Managed Care – PPO | Source: Ambulatory Visit

## 2021-10-21 LAB — CBC
HCT: 27.7 % — ABNORMAL LOW (ref 36.0–46.0)
HCT: 24.3 % — ABNORMAL LOW (ref 36.0–46.0)
Hemoglobin: 8.4 g/dL — ABNORMAL LOW (ref 12.0–15.0)
Hemoglobin: 7.5 g/dL — ABNORMAL LOW (ref 12.0–15.0)
MCH: 23.7 pg — ABNORMAL LOW (ref 26.0–34.0)
MCH: 24 pg — ABNORMAL LOW (ref 26.0–34.0)
MCHC: 30.3 g/dL (ref 30.0–36.0)
MCHC: 30.9 g/dL (ref 30.0–36.0)
MCV: 78 fL — ABNORMAL LOW (ref 80.0–100.0)
MCV: 77.9 fL — ABNORMAL LOW (ref 80.0–100.0)
Platelets: 283 10*3/uL (ref 150–400)
Platelets: 285 10*3/uL (ref 150–400)
RBC: 3.55 MIL/uL — ABNORMAL LOW (ref 3.87–5.11)
RBC: 3.12 MIL/uL — ABNORMAL LOW (ref 3.87–5.11)
RDW: 18.6 % — ABNORMAL HIGH (ref 11.5–15.5)
RDW: 18.6 % — ABNORMAL HIGH (ref 11.5–15.5)
WBC: 18.5 10*3/uL — ABNORMAL HIGH (ref 4.0–10.5)
WBC: 17.2 10*3/uL — ABNORMAL HIGH (ref 4.0–10.5)
nRBC: 0.2 % (ref 0.0–0.2)
nRBC: 0 % (ref 0.0–0.2)

## 2021-10-21 LAB — RPR: RPR Ser Ql: NONREACTIVE

## 2021-10-21 MED ORDER — SIMETHICONE 80 MG PO CHEW
80.0000 mg | CHEWABLE_TABLET | Freq: Three times a day (TID) | ORAL | Status: DC
  Administered 2021-10-21 – 2021-10-22 (×3): 80 mg via ORAL
  Filled 2021-10-21 (×4): qty 1

## 2021-10-21 MED ORDER — KETOROLAC TROMETHAMINE 30 MG/ML IJ SOLN
30.0000 mg | Freq: Four times a day (QID) | INTRAMUSCULAR | Status: AC
  Administered 2021-10-21 (×4): 30 mg via INTRAVENOUS
  Filled 2021-10-21 (×2): qty 1

## 2021-10-21 MED ORDER — BISACODYL 10 MG RE SUPP: 10.0000 mg | Freq: Every day | RECTAL | Status: DC | PRN

## 2021-10-21 MED ORDER — ACETAMINOPHEN 500 MG PO TABS
1000.0000 mg | ORAL_TABLET | Freq: Four times a day (QID) | ORAL | Status: DC
  Administered 2021-10-21 – 2021-10-22 (×5): 1000 mg via ORAL
  Filled 2021-10-21 (×6): qty 2

## 2021-10-21 MED ORDER — WITCH HAZEL-GLYCERIN EX PADS: 1.0000 | MEDICATED_PAD | CUTANEOUS | Status: DC | PRN

## 2021-10-21 MED ORDER — PRENATAL MULTIVITAMIN CH
1.0000 | ORAL_TABLET | Freq: Every day | ORAL | Status: DC
  Administered 2021-10-21 – 2021-10-22 (×2): 1 via ORAL
  Filled 2021-10-21 (×2): qty 1

## 2021-10-21 MED ORDER — ACETAMINOPHEN 325 MG PO TABS
650.0000 mg | ORAL_TABLET | Freq: Four times a day (QID) | ORAL | Status: DC
  Administered 2021-10-21: 650 mg via ORAL
  Filled 2021-10-21: qty 2

## 2021-10-21 MED ORDER — COCONUT OIL OIL: 1.0000 | TOPICAL_OIL | Status: DC | PRN

## 2021-10-21 MED ORDER — MEASLES, MUMPS & RUBELLA VAC IJ SOLR
0.5000 mL | Freq: Once | INTRAMUSCULAR | Status: DC
  Filled 2021-10-21: qty 0.5

## 2021-10-21 MED ORDER — MENTHOL 3 MG MT LOZG: 1.0000 | LOZENGE | OROMUCOSAL | Status: DC | PRN

## 2021-10-21 MED ORDER — SODIUM CHLORIDE 0.9 % IV SOLN
300.0000 mg | Freq: Once | INTRAVENOUS | Status: AC
  Administered 2021-10-21: 300 mg via INTRAVENOUS
  Filled 2021-10-21: qty 300

## 2021-10-21 MED ORDER — OXYTOCIN-SODIUM CHLORIDE 30-0.9 UT/500ML-% IV SOLN
2.5000 [IU]/h | INTRAVENOUS | Status: AC
  Administered 2021-10-21: 2.5 [IU]/h via INTRAVENOUS
  Filled 2021-10-21: qty 500

## 2021-10-21 MED ORDER — NALOXONE HCL 4 MG/10ML IJ SOLN: 1.0000 ug/kg/h | INTRAVENOUS | Status: DC | PRN

## 2021-10-21 MED ORDER — FLEET ENEMA 7-19 GM/118ML RE ENEM: 1.0000 | ENEMA | Freq: Every day | RECTAL | Status: DC | PRN

## 2021-10-21 MED ORDER — SERTRALINE HCL 25 MG PO TABS
50.0000 mg | ORAL_TABLET | Freq: Every day | ORAL | Status: DC
  Administered 2021-10-21 – 2021-10-22 (×2): 50 mg via ORAL
  Filled 2021-10-21 (×2): qty 2

## 2021-10-21 MED ORDER — OXYCODONE HCL 5 MG PO TABS: 5.0000 mg | ORAL_TABLET | ORAL | Status: AC | PRN

## 2021-10-21 MED ORDER — IBUPROFEN 600 MG PO TABS
600.0000 mg | ORAL_TABLET | Freq: Four times a day (QID) | ORAL | Status: DC
  Administered 2021-10-22 (×2): 600 mg via ORAL
  Filled 2021-10-21 (×2): qty 1

## 2021-10-21 MED ORDER — FERROUS SULFATE 325 (65 FE) MG PO TABS
325.0000 mg | ORAL_TABLET | Freq: Two times a day (BID) | ORAL | Status: DC
  Administered 2021-10-21 – 2021-10-22 (×3): 325 mg via ORAL
  Filled 2021-10-21 (×3): qty 1

## 2021-10-21 MED ORDER — OXYCODONE HCL 5 MG PO TABS: 5.0000 mg | ORAL_TABLET | ORAL | Status: DC | PRN

## 2021-10-21 MED ORDER — ONDANSETRON HCL 4 MG/2ML IJ SOLN: 4.0000 mg | Freq: Three times a day (TID) | INTRAMUSCULAR | Status: DC | PRN

## 2021-10-21 MED ORDER — DIPHENHYDRAMINE HCL 50 MG/ML IJ SOLN: 12.5000 mg | INTRAMUSCULAR | Status: DC | PRN

## 2021-10-21 MED ORDER — OXYCODONE HCL 5 MG/5ML PO SOLN
5.0000 mg | Freq: Once | ORAL | Status: DC | PRN
  Filled 2021-10-21: qty 5

## 2021-10-21 MED ORDER — SIMETHICONE 80 MG PO CHEW
80.0000 mg | CHEWABLE_TABLET | ORAL | Status: DC | PRN
  Administered 2021-10-21: 80 mg via ORAL

## 2021-10-21 MED ORDER — DIBUCAINE (PERIANAL) 1 % EX OINT: 1.0000 | TOPICAL_OINTMENT | CUTANEOUS | Status: DC | PRN

## 2021-10-21 MED ORDER — NALOXONE HCL 0.4 MG/ML IJ SOLN: 0.4000 mg | INTRAMUSCULAR | Status: DC | PRN

## 2021-10-21 MED ORDER — TETANUS-DIPHTH-ACELL PERTUSSIS 5-2.5-18.5 LF-MCG/0.5 IM SUSY: 0.5000 mL | PREFILLED_SYRINGE | Freq: Once | INTRAMUSCULAR | Status: DC

## 2021-10-21 MED ORDER — SENNOSIDES-DOCUSATE SODIUM 8.6-50 MG PO TABS
2.0000 | ORAL_TABLET | ORAL | Status: DC
  Administered 2021-10-21 – 2021-10-22 (×2): 2 via ORAL
  Filled 2021-10-21 (×2): qty 2

## 2021-10-21 MED ORDER — OXYCODONE HCL 5 MG PO TABS: 5.0000 mg | ORAL_TABLET | Freq: Once | ORAL | Status: DC | PRN

## 2021-10-21 MED ORDER — LACTATED RINGERS IV SOLN: INTRAVENOUS | Status: DC

## 2021-10-21 MED ORDER — DIPHENHYDRAMINE HCL 25 MG PO CAPS: 25.0000 mg | ORAL_CAPSULE | ORAL | Status: DC | PRN

## 2021-10-21 MED ORDER — DIPHENHYDRAMINE HCL 25 MG PO CAPS: 25.0000 mg | ORAL_CAPSULE | Freq: Four times a day (QID) | ORAL | Status: DC | PRN

## 2021-10-21 MED ORDER — GABAPENTIN 300 MG PO CAPS
300.0000 mg | ORAL_CAPSULE | Freq: Every day | ORAL | Status: DC
  Administered 2021-10-21: 300 mg via ORAL
  Filled 2021-10-21: qty 1

## 2021-10-21 MED ORDER — MEPERIDINE HCL 25 MG/ML IJ SOLN: 6.2500 mg | INTRAMUSCULAR | Status: DC | PRN

## 2021-10-21 MED ORDER — SODIUM CHLORIDE 0.9% FLUSH: 3.0000 mL | INTRAVENOUS | Status: DC | PRN

## 2021-10-21 MED ORDER — FENTANYL CITRATE (PF) 100 MCG/2ML IJ SOLN: 25.0000 ug | INTRAMUSCULAR | Status: DC | PRN

## 2021-10-21 MED ORDER — KETOROLAC TROMETHAMINE 30 MG/ML IJ SOLN: 30.0000 mg | Freq: Four times a day (QID) | INTRAMUSCULAR | Status: DC

## 2021-10-21 MED ORDER — KETOROLAC TROMETHAMINE 30 MG/ML IJ SOLN
30.0000 mg | Freq: Four times a day (QID) | INTRAMUSCULAR | Status: AC
  Filled 2021-10-21 (×4): qty 1

## 2021-10-21 NOTE — Op Note (Addendum)
  Cesarean Section Procedure Note  Date of procedure: 10/21/2021   Pre-operative Diagnosis: Intrauterine pregnancy at [redacted]w[redacted]d;  - prior cesarean section - active labor x8 hours, 4cm dilated with frequent painful contractions  Post-operative Diagnosis: same, delivered.  Procedure: Repeat Low Transverse Cesarean Section through Pfannenstiel incision  Surgeon: Christeen Douglas, MD  Assistant(s):  Donato Schultz, CNM   Anesthesia: Spinal anesthesia  Anesthesiologist: Piscitello, Cleda Mccreedy, MD Anesthesiologist: Randa Ngo Cleda Mccreedy, MD CRNA: Mathews Argyle, CRNA  Estimated Blood Loss:           Drains: foley         Total IV Fluids:  Urine Output:         Specimens: cord blood and arterial cord gas         Complications:  None; patient tolerated the procedure well.         Disposition: PACU - hemodynamically stable.         Condition: stable  Findings:  A female infant "Ames" in cephalic presentation. Amniotic fluid - Meconium  Birth weight 3515 g.  Apgars of 8 and 9 at one and five minutes respectively.  Intact placenta with a three-vessel cord.  Grossly normal uterus, tubes and ovaries bilaterally. Minimal intraabdominal adhesions were noted.  Indications:  Prior cesarean  Procedure Details  The patient was taken to Operating Room, identified as the correct patient and the procedure verified as C-Section Delivery. A formal Time Out was held with all team members present and in agreement.  After induction of anesthesia, the patient was draped and prepped in the usual sterile manner. A Pfannenstiel skin incision was made and carried down through the subcutaneous tissue to the fascia. Fascial incision was made and extended transversely with the Mayo scissors. The fascia was separated from the underlying rectus tissue superiorly and inferiorly. The peritoneum was identified and entered bluntly. Peritoneal incision was extended longitudinally. The utero-vesical  peritoneal reflection was incised transversely and a bladder flap was created digitally.   A low transverse hysterotomy was made. The fetus was delivered atraumatically. The umbilical cord was clamped x2 and cut and the infant was handed to the awaiting pediatricians. The placenta was removed intact and appeared normal, intact, and with a 3-vessel cord.   The uterus was exteriorized and cleared of all clot and debris. The hysterotomy was closed with running sutures of 0-Vicryl.  Excellent hemostasis was observed. The peritoneal cavity was cleared of all clots and debris. The uterus was returned to the abdomen.   The pelvis was irrigated and again, excellent hemostasis was noted. The fascia was then reapproximated with running sutures of 0 Vicryl. The skin was reapproximated with Ensorb.  bupivicaine placed in the fascial and skin lines.  Instrument, sponge, and needle counts were correct prior to the abdominal closure and at the conclusion of the case.   The patient tolerated the procedure well and was transferred to the recovery room in stable condition.   Christeen Douglas, MD 10/21/2021

## 2021-10-21 NOTE — Transfer of Care (Signed)
Immediate Anesthesia Transfer of Care Note  Patient: Jessica Waters  Procedure(s) Performed: REPEAT CESAREAN SECTION  Patient Location: Mother/Baby  Anesthesia Type:Spinal  Level of Consciousness: awake, alert  and oriented  Airway & Oxygen Therapy: Patient Spontanous Breathing  Post-op Assessment: Report given to RN and Post -op Vital signs reviewed and stable  Post vital signs: Reviewed  Last Vitals:  Vitals Value Taken Time  BP    Temp    Pulse    Resp    SpO2      Last Pain:  Vitals:   10/20/21 2104  TempSrc: Oral  PainSc:          Complications: No notable events documented.

## 2021-10-21 NOTE — Op Note (Incomplete)
  Cesarean Section Procedure Note  Date of procedure: 10/21/2021   Pre-operative Diagnosis: Intrauterine pregnancy at [redacted]w[redacted]d;  - prior cesarean section - active labor  Post-operative Diagnosis: same, delivered.  Procedure: Repeat Low Transverse Cesarean Section through Pfannenstiel incision  Surgeon: Christeen Douglas, MD  Assistant(s):  Donato Schultz, CNM   Anesthesia: Spinal anesthesia  Anesthesiologist: Piscitello, Cleda Mccreedy, MD Anesthesiologist: Randa Ngo Cleda Mccreedy, MD CRNA: Mathews Argyle, CRNA  Estimated Blood Loss:           Drains: foley         Total IV Fluids:  Urine Output:         Specimens: cord blood and arterial cord gas         Complications:  None; patient tolerated the procedure well.         Disposition: PACU - hemodynamically stable.         Condition: stable  Findings:  A female infant "Ames" in cephalic presentation. Amniotic fluid - Meconium  Birth weight *** g.  Apgars of *** and *** at one and five minutes respectively.  Intact placenta with a three-vessel cord.  Grossly normal uterus, tubes and ovaries bilaterally. *** intraabdominal adhesions were noted.  Indications: {c-section indications:32119}  Procedure Details  The patient was taken to Operating Room, identified as the correct patient and the procedure verified as C-Section Delivery. A formal Time Out was held with all team members present and in agreement.  After induction of anesthesia, the patient was draped and prepped in the usual sterile manner. A Pfannenstiel skin incision was made and carried down through the subcutaneous tissue to the fascia. Fascial incision was made and extended transversely with the Mayo scissors. The fascia was separated from the underlying rectus tissue superiorly and inferiorly. The peritoneum was identified and entered bluntly. Peritoneal incision was extended longitudinally. The utero-vesical peritoneal reflection was incised  transversely and a bladder flap was created digitally.   A low transverse hysterotomy was made. The fetus was delivered atraumatically. The umbilical cord was clamped x2 and cut and the infant was handed to the awaiting pediatricians. The placenta was removed intact and appeared normal, intact, and with a 3-vessel cord.   The uterus was exteriorized and cleared of all clot and debris. The hysterotomy was closed with running sutures of 0-Vicryl***. A second imbricating layer was placed with the same suture. Excellent hemostasis was observed. The peritoneal cavity was cleared of all clots and debris. The uterus was returned to the abdomen.   The pelvis was irrigated and again, excellent hemostasis was noted. The fascia was then reapproximated with running sutures of 0 Vicryl. *** The subcutaneous tissue was reapproximated with {Running/interrupted:12557} sutures of 0 Vicry. The skin was reapproximated with a 4-0 Monocryl subcuticular stitch. *** 7ml (in 30 of 0.5% bupivicaine and 87ml of NSS) of liposomal bupivicaine placed in the fascial and skin lines.  Instrument, sponge, and needle counts were correct prior to the abdominal closure and at the conclusion of the case.   The patient tolerated the procedure well and was transferred to the recovery room in stable condition.   Christeen Douglas, MD 10/21/2021

## 2021-10-21 NOTE — Progress Notes (Signed)
Postop Day  1  Subjective: no complaints, tolerating PO, and + flatus  Doing well, no concerns. Ambulating without difficulty, pain managed with PO meds, tolerating regular diet, and voiding without difficulty.   No fever/chills, chest pain, shortness of breath, nausea/vomiting, or leg pain. No nipple or breast pain. No headache, visual changes, or RUQ/epigastric pain.  Objective: BP 125/89 (BP Location: Right Arm)   Pulse 78   Temp 98 F (36.7 C) (Oral)   Resp 15   Ht 5' (1.524 m)   Wt 64.4 kg   LMP 01/21/2021   SpO2 99%   Breastfeeding Unknown   BMI 27.73 kg/m    Physical Exam:  General: alert, cooperative, and appears stated age Breasts: soft/nontender CV: RRR Pulm: nl effort, CTABL Abdomen: soft, non-tender, active bowel sounds Uterine Fundus: firm Incision: healing well, no significant drainage, no dehiscence, no significant erythema Perineum: minimal edema, intact Lochia: appropriate DVT Evaluation: No evidence of DVT seen on physical exam. Negative Homan's sign. No cords or calf tenderness. No significant calf/ankle edema.  Recent Labs    10/20/21 2139 10/21/21 0539  HGB 8.2* 8.4*  HCT 26.2* 27.7*  WBC 13.7* 18.5*  PLT 307 283    Assessment/Plan: 26 y.o. G2P2002 postop day # 1  -Continue routine postpartum care - Encouraged snug fitting bra, cold application, Tylenol PRN, and cabbage leaves for engorgement for formula feeding  -Discussed contraceptive options including implant, IUDs hormonal and non-hormonal, injection, pills/ring/patch, condoms, and NFP.  -Acute blood loss anemia - hemodynamically stable and asymptomatic; start PO ferrous sulfate BID with stool softeners  -Immunization status:   all immunizations up to date   Disposition: Continue inpatient postpartum care    LOS: 1 day   Mekhi Lascola Wonda Amis, CNM 10/21/2021, 12:35 PM   ----- Chari Manning Certified Nurse Midwife [facility]

## 2021-10-22 LAB — CBC
HCT: 28.7 % — ABNORMAL LOW (ref 36.0–46.0)
Hemoglobin: 8.7 g/dL — ABNORMAL LOW (ref 12.0–15.0)
MCH: 23.9 pg — ABNORMAL LOW (ref 26.0–34.0)
MCHC: 30.3 g/dL (ref 30.0–36.0)
MCV: 78.8 fL — ABNORMAL LOW (ref 80.0–100.0)
Platelets: 296 10*3/uL (ref 150–400)
RBC: 3.64 MIL/uL — ABNORMAL LOW (ref 3.87–5.11)
RDW: 20.1 % — ABNORMAL HIGH (ref 11.5–15.5)
WBC: 15.9 10*3/uL — ABNORMAL HIGH (ref 4.0–10.5)
nRBC: 0.2 % (ref 0.0–0.2)

## 2021-10-22 MED ORDER — COCONUT OIL OIL: 1.0000 | TOPICAL_OIL | 0 refills | Status: AC | PRN

## 2021-10-22 MED ORDER — FERROUS SULFATE 325 (65 FE) MG PO TABS: 325.0000 mg | ORAL_TABLET | Freq: Two times a day (BID) | ORAL | 0 refills | Status: AC

## 2021-10-22 MED ORDER — OXYCODONE HCL 5 MG PO TABS: 5.0000 mg | ORAL_TABLET | Freq: Four times a day (QID) | ORAL | 0 refills | Status: AC | PRN

## 2021-10-22 MED ORDER — GABAPENTIN 300 MG PO CAPS: 300.0000 mg | ORAL_CAPSULE | Freq: Every day | ORAL | 0 refills | Status: AC

## 2021-10-22 MED ORDER — ACETAMINOPHEN 500 MG PO TABS: 1000.0000 mg | ORAL_TABLET | Freq: Four times a day (QID) | ORAL | 0 refills | Status: AC

## 2021-10-22 MED ORDER — SIMETHICONE 80 MG PO CHEW: 80.0000 mg | CHEWABLE_TABLET | Freq: Three times a day (TID) | ORAL | 0 refills | Status: AC

## 2021-10-22 MED ORDER — IBUPROFEN 600 MG PO TABS: 600.0000 mg | ORAL_TABLET | Freq: Four times a day (QID) | ORAL | 0 refills | Status: AC

## 2021-10-22 MED ORDER — SENNOSIDES-DOCUSATE SODIUM 8.6-50 MG PO TABS: 2.0000 | ORAL_TABLET | ORAL | 0 refills | Status: AC

## 2021-10-22 NOTE — Progress Notes (Signed)
Discharge instructions, appointments, prescriptions, and education given and explained. Pt and S/O verbalized understanding with no further questions. Pt, belongings and infant wheeled to personal vehicle for d/c home.

## 2021-10-22 NOTE — Discharge Summary (Signed)
Postpartum Discharge Summary    Patient Name: Jessica Waters DOB: 08-03-1995 MRN: [id number]  Date of admission: 10/20/2021 Delivery date:10/20/2021  Delivering provider: Benjaman Kindler  Date of discharge: 10/22/2021  Admitting diagnosis: History of cesarean delivery [Z98.891] Intrauterine pregnancy: [redacted]w[redacted]d    Secondary diagnosis:  Principal Problem:   History of cesarean delivery  Additional problems: iron deficiency anemia- acute blood loss.      Discharge diagnosis: Term Pregnancy Delivered                                              Post partum procedures: venofer given Augmentation: N/A Complications: None  Hospital course: Onset of Labor With Unplanned C/S   26y.o. yo [id number] admitted in Active Labor on 10/20/2021. Patient had a labor course significant for Prior CS. The patient went for cesarean section due to Elective Repeat. Delivery details as follows: Membrane Rupture Time/Date: 11:30 PM ,10/20/2021   Delivery Method:C-Section, Low Transverse  Details of operation can be found in separate operative note. Patient had an uncomplicated postpartum course.  She is ambulating,tolerating a regular diet, passing flatus, and urinating well.  Patient is discharged home in stable condition 10/22/21.  Newborn Data: Birth date:10/20/2021  Birth time:11:31 PM  Gender:Female  Living status:Living  Apgars:8 ,9  Weight:3520 g   Magnesium Sulfate received: No BMZ received: No Rhophylac:No MMR:N/A T-DaP:Given prenatally Flu: N/A Transfusion:No  Physical exam  Vitals:   10/21/21 0830 10/21/21 1516 10/21/21 2312 10/22/21 0836  BP: 125/89 114/77 108/75 130/87  Pulse: 78 71 84 91  Resp: '18 18 20 18  ' Temp: 98 F (36.7 C) 97.9 F (36.6 C) 98.1 F (36.7 C) 98 F (36.7 C)  TempSrc: Oral Oral  Oral  SpO2: 99%  97% 99%  Weight:      Height:       General: alert, cooperative, and no distress Lochia: appropriate Uterine Fundus: firm Incision: Honeycomb dressing is  clean, dry, and intact DVT Evaluation: No evidence of DVT seen on physical exam. Labs: Lab Results  Component Value Date   WBC 17.2 (H) 10/21/2021   HGB 7.5 (L) 10/21/2021   HCT 24.3 (L) 10/21/2021   MCV 77.9 (L) 10/21/2021   PLT 285 10/21/2021      Latest Ref Rng & Units 10/14/2021    3:08 PM  CMP  Glucose 70 - 99 mg/dL 82   BUN 6 - 20 mg/dL 10   Creatinine 0.44 - 1.00 mg/dL 0.65   Sodium 135 - 145 mmol/L 138   Potassium 3.5 - 5.1 mmol/L 3.8   Chloride 98 - 111 mmol/L 108   CO2 22 - 32 mmol/L 23   Calcium 8.9 - 10.3 mg/dL 8.3   Total Protein 6.5 - 8.1 g/dL 6.3   Total Bilirubin 0.3 - 1.2 mg/dL 0.4   Alkaline Phos 38 - 126 U/L 160   AST 15 - 41 U/L 14   ALT 0 - 44 U/L 9    Edinburgh Score:    11/03/2019    4:51 PM  Edinburgh Postnatal Depression Scale Screening Tool  I have been able to laugh and see the funny side of things. 0  I have looked forward with enjoyment to things. 0  I have blamed myself unnecessarily when things went wrong. 2  I have been anxious or worried for no  good reason. 2  I have felt scared or panicky for no good reason. 2  Things have been getting on top of me. 1  I have been so unhappy that I have had difficulty sleeping. 1  I have felt sad or miserable. 1  I have been so unhappy that I have been crying. 1  The thought of harming myself has occurred to me. 0  Edinburgh Postnatal Depression Scale Total 10      After visit meds:  Allergies as of 10/22/2021       Reactions   Pork-derived Products Swelling   Face swelling and severe cramps   Beef-derived Products         Medication List     STOP taking these medications    aspirin EC 81 MG tablet       TAKE these medications    acetaminophen 500 MG tablet Commonly known as: TYLENOL Take 2 tablets (1,000 mg total) by mouth every 6 (six) hours. What changed:  when to take this reasons to take this   coconut oil Oil Apply 1 Application topically as needed.   ferrous sulfate  325 (65 FE) MG tablet Take 1 tablet (325 mg total) by mouth 2 (two) times daily with a meal.   gabapentin 300 MG capsule Commonly known as: NEURONTIN Take 1 capsule (300 mg total) by mouth at bedtime for 3 days.   ibuprofen 600 MG tablet Commonly known as: ADVIL Take 1 tablet (600 mg total) by mouth every 6 (six) hours.   multivitamin-prenatal 27-0.8 MG Tabs tablet Take 1 tablet by mouth daily at 12 noon.   oxyCODONE 5 MG immediate release tablet Commonly known as: Oxy IR/ROXICODONE Take 1 tablet (5 mg total) by mouth every 6 (six) hours as needed for up to 7 days for breakthrough pain.   senna-docusate 8.6-50 MG tablet Commonly known as: Senokot-S Take 2 tablets by mouth daily.   sertraline 50 MG tablet Commonly known as: ZOLOFT Take 50 mg by mouth daily.   simethicone 80 MG chewable tablet Commonly known as: MYLICON Chew 1 tablet (80 mg total) by mouth 3 (three) times daily after meals.               Discharge Care Instructions  (From admission, onward)           Start     Ordered   10/22/21 0000  Discharge wound care:       Comments: Remove honeycomb dressing on day 7 postop.   10/22/21 0949             Discharge home in stable condition Infant Feeding: Breast Infant Disposition:home with mother Discharge instruction: per After Visit Summary and Postpartum booklet. Activity: Advance as tolerated. Pelvic rest for 6 weeks.  Diet: routine diet Anticipated Birth Control:  NFP Postpartum Appointment:2 weeks Additional Postpartum F/U:  n/a Future Appointments:No future appointments. Follow up Visit:  Follow-up Information     Benjaman Kindler, MD Follow up in 2 week(s).   Specialty: Obstetrics and Gynecology Why: For postop check Contact information: [address] [place] [address] [phone]                     10/22/2021 Francetta Found, CNM

## 2021-10-22 NOTE — Anesthesia Postprocedure Evaluation (Signed)
Anesthesia Post Note  Patient: Clair A Zerby  Procedure(s) Performed: REPEAT CESAREAN SECTION  Patient location during evaluation: Mother Baby Anesthesia Type: Spinal Level of consciousness: oriented and awake and alert Pain management: pain level controlled Vital Signs Assessment: post-procedure vital signs reviewed and stable Respiratory status: spontaneous breathing and respiratory function stable Cardiovascular status: blood pressure returned to baseline and stable Postop Assessment: no headache, no backache, no apparent nausea or vomiting and able to ambulate Anesthetic complications: no   No notable events documented.   Last Vitals:  Vitals:   10/21/21 2312 10/22/21 0836  BP: 108/75 130/87  Pulse: 84 91  Resp: 20 18  Temp: 36.7 C 36.7 C  SpO2: 97% 99%    Last Pain:  Vitals:   10/22/21 0836  TempSrc: Oral  PainSc:                  Stephanie Coup

## 2021-10-22 NOTE — Anesthesia Post-op Follow-up Note (Signed)
  Anesthesia Pain Follow-up Note  Patient: Jessica Waters  Day #: 1  Date of Follow-up: 10/22/2021 Time: 9:21 AM  Last Vitals:  Vitals:   10/21/21 2312 10/22/21 0836  BP: 108/75 130/87  Pulse: 84 91  Resp: 20 18  Temp: 36.7 C 36.7 C  SpO2: 97% 99%    Level of Consciousness: alert  Pain: none   Side Effects:None  Catheter Site Exam:clean     Plan: D/C from anesthesia care at surgeon's request  [employer]

## 2021-10-24 ENCOUNTER — Inpatient Hospital Stay
Admission: RE | Admit: 2021-10-24 | Payer: BC Managed Care – PPO | Source: Home / Self Care | Admitting: Obstetrics and Gynecology

## 2021-10-24 DIAGNOSIS — O34219 Maternal care for unspecified type scar from previous cesarean delivery: Secondary | ICD-10-CM
